# Patient Record
Sex: Female | Born: 1975 | Race: Black or African American | Hispanic: No | Marital: Single | Smoking: Never smoker
Health system: Southern US, Community
[De-identification: ages and names within clinical notes are randomized; demographics above are authoritative.]

## PROBLEM LIST (undated history)

## (undated) DIAGNOSIS — Q625 Duplication of ureter: Secondary | ICD-10-CM

## (undated) DIAGNOSIS — K259 Gastric ulcer, unspecified as acute or chronic, without hemorrhage or perforation: Secondary | ICD-10-CM

## (undated) DIAGNOSIS — D259 Leiomyoma of uterus, unspecified: Secondary | ICD-10-CM

## (undated) HISTORY — DX: Leiomyoma of uterus, unspecified: D25.9

## (undated) HISTORY — DX: Gastric ulcer, unspecified as acute or chronic, without hemorrhage or perforation: K25.9

---

## 2017-04-11 ENCOUNTER — Ambulatory Visit: Payer: Self-pay | Admitting: Certified Nurse Midwife

## 2017-04-22 ENCOUNTER — Ambulatory Visit (INDEPENDENT_AMBULATORY_CARE_PROVIDER_SITE_OTHER): Payer: BLUE CROSS/BLUE SHIELD | Admitting: Obstetrics & Gynecology

## 2017-04-22 ENCOUNTER — Encounter: Payer: Self-pay | Admitting: Obstetrics & Gynecology

## 2017-04-22 VITALS — BP 127/76 | HR 90 | Ht 66.0 in | Wt 258.0 lb

## 2017-04-22 DIAGNOSIS — R1907 Generalized intra-abdominal and pelvic swelling, mass and lump: Secondary | ICD-10-CM

## 2017-04-22 DIAGNOSIS — Z1151 Encounter for screening for human papillomavirus (HPV): Secondary | ICD-10-CM

## 2017-04-22 DIAGNOSIS — Z124 Encounter for screening for malignant neoplasm of cervix: Secondary | ICD-10-CM | POA: Diagnosis not present

## 2017-04-22 DIAGNOSIS — Z01411 Encounter for gynecological examination (general) (routine) with abnormal findings: Secondary | ICD-10-CM | POA: Diagnosis not present

## 2017-04-22 DIAGNOSIS — R1900 Intra-abdominal and pelvic swelling, mass and lump, unspecified site: Secondary | ICD-10-CM

## 2017-04-22 DIAGNOSIS — Z01419 Encounter for gynecological examination (general) (routine) without abnormal findings: Secondary | ICD-10-CM

## 2017-04-22 NOTE — Patient Instructions (Signed)
Uterine Fibroids Uterine fibroids are tissue masses (tumors) that can develop in the womb (uterus). They are also called leiomyomas. This type of tumor is not cancerous (benign) and does not spread to other parts of the body outside of the pelvic area, which is between the hip bones. Occasionally, fibroids may develop in the fallopian tubes, in the cervix, or on the support structures (ligaments) that surround the uterus. You can have one or many fibroids. Fibroids can vary in size, weight, and where they grow in the uterus. Some can become quite large. Most fibroids do not require medical treatment. What are the causes? A fibroid can develop when a single uterine cell keeps growing (replicating). Most cells in the human body have a control mechanism that keeps them from replicating without control. What are the signs or symptoms? Symptoms may include:  Heavy bleeding during your period.  Bleeding or spotting between periods.  Pelvic pain and pressure.  Bladder problems, such as needing to urinate more often (urinary frequency) or urgently.  Inability to reproduce offspring (infertility).  Miscarriages.  How is this diagnosed? Uterine fibroids are diagnosed through a physical exam. Your health care provider may feel the lumpy tumors during a pelvic exam. Ultrasonography and an MRI may be done to determine the size, location, and number of fibroids. How is this treated? Treatment may include:  Watchful waiting. This involves getting the fibroid checked by your health care provider to see if it grows or shrinks. Follow your health care provider's recommendations for how often to have this checked.  Hormone medicines. These can be taken by mouth or given through an intrauterine device (IUD).  Surgery. ? Removing the fibroids (myomectomy) or the uterus (hysterectomy). ? Removing blood supply to the fibroids (uterine artery embolization).  If fibroids interfere with your fertility and you  want to become pregnant, your health care provider may recommend having the fibroids removed. Follow these instructions at home:  Keep all follow-up visits as directed by your health care provider. This is important.  Take over-the-counter and prescription medicines only as told by your health care provider. ? If you were prescribed a hormone treatment, take the hormone medicines exactly as directed.  Ask your health care provider about taking iron pills and increasing the amount of dark green, leafy vegetables in your diet. These actions can help to boost your blood iron levels, which may be affected by heavy menstrual bleeding.  Pay close attention to your period and tell your health care provider about any changes, such as: ? Increased blood flow that requires you to use more pads or tampons than usual per month. ? A change in the number of days that your period lasts per month. ? A change in symptoms that are associated with your period, such as abdominal cramping or back pain. Contact a health care provider if:  You have pelvic pain, back pain, or abdominal cramps that cannot be controlled with medicines.  You have an increase in bleeding between and during periods.  You soak tampons or pads in a half hour or less.  You feel lightheaded, extra tired, or weak. Get help right away if:  You faint.  You have a sudden increase in pelvic pain. This information is not intended to replace advice given to you by your health care provider. Make sure you discuss any questions you have with your health care provider. Document Released: 01/12/2000 Document Revised: 09/14/2015 Document Reviewed: 07/13/2013 Elsevier Interactive Patient Education  [employer].  Uterine Artery Embolization for Fibroids, Care After Refer to this sheet in the next few weeks. These instructions provide you with information on caring for yourself after your procedure. Your health care provider may also give  you more specific instructions. Your treatment has been planned according to current medical practices, but problems sometimes occur. Call your health care provider if you have any problems or questions after your procedure. What can I expect after the procedure? After your procedure, it is typical to have cramping in the pelvis. You will be given pain medicine to control it. Follow these instructions at home:  Only take over-the-counter or prescription medicines for pain, discomfort, or fever as directed by your health care provider.  Do not take aspirin. It can cause bleeding.  Follow your health care provider's advice regarding medicines given to you, diet, activity, and when to begin sexual activity.  See your health care provider for follow-up care as directed. Contact a health care provider if:  You have a fever.  You have redness, swelling, and pain around your incision site.  You have pus draining from your incision.  You have a rash. Get help right away if:  You have bleeding from your incision site.  You have difficulty breathing.  You have chest pain.  You have severe abdominal pain.  You have leg pain.  You become dizzy and faint. This information is not intended to replace advice given to you by your health care provider. Make sure you discuss any questions you have with your health care provider. Document Released: 11/04/2012 Document Revised: 06/22/2015 Document Reviewed: 07/30/2012 Elsevier Interactive Patient Education  2018 [place] Years, Female Preventive care refers to lifestyle choices and visits with your health care provider that can promote health and wellness. What does preventive care include?  A yearly physical exam. This is also called an annual well check.  Dental exams once or twice a year.  Routine eye exams. Ask your health care provider how often you should have your eyes checked.  Personal lifestyle  choices, including: ? Daily care of your teeth and gums. ? Regular physical activity. ? Eating a healthy diet. ? Avoiding tobacco and drug use. ? Limiting alcohol use. ? Practicing safe sex. ? Taking low-dose aspirin daily starting at age 64. ? Taking vitamin and mineral supplements as recommended by your health care provider. What happens during an annual well check? The services and screenings done by your health care provider during your annual well check will depend on your age, overall health, lifestyle risk factors, and family history of disease. Counseling Your health care provider may ask you questions about your:  Alcohol use.  Tobacco use.  Drug use.  Emotional well-being.  Home and relationship well-being.  Sexual activity.  Eating habits.  Work and work Statistician.  Method of birth control.  Menstrual cycle.  Pregnancy history.  Screening You may have the following tests or measurements:  Height, weight, and BMI.  Blood pressure.  Lipid and cholesterol levels. These may be checked every 5 years, or more frequently if you are over 60 years old.  Skin check.  Lung cancer screening. You may have this screening every year starting at age 23 if you have a 30-pack-year history of smoking and currently smoke or have quit within the past 15 years.  Fecal occult blood test (FOBT) of the stool. You may have this test every year starting at age 64.  Flexible sigmoidoscopy or colonoscopy. You  may have a sigmoidoscopy every 5 years or a colonoscopy every 10 years starting at age 31.  Hepatitis C blood test.  Hepatitis B blood test.  Sexually transmitted disease (STD) testing.  Diabetes screening. This is done by checking your blood sugar (glucose) after you have not eaten for a while (fasting). You may have this done every 1-3 years.  Mammogram. This may be done every 1-2 years. Talk to your health care provider about when you should start having regular  mammograms. This may depend on whether you have a family history of breast cancer.  BRCA-related cancer screening. This may be done if you have a family history of breast, ovarian, tubal, or peritoneal cancers.  Pelvic exam and Pap test. This may be done every 3 years starting at age 41. Starting at age 17, this may be done every 5 years if you have a Pap test in combination with an HPV test.  Bone density scan. This is done to screen for osteoporosis. You may have this scan if you are at high risk for osteoporosis.  Discuss your test results, treatment options, and if necessary, the need for more tests with your health care provider. Vaccines Your health care provider may recommend certain vaccines, such as:  Influenza vaccine. This is recommended every year.  Tetanus, diphtheria, and acellular pertussis (Tdap, Td) vaccine. You may need a Td booster every 10 years.  Varicella vaccine. You may need this if you have not been vaccinated.  Zoster vaccine. You may need this after age 50.  Measles, mumps, and rubella (MMR) vaccine. You may need at least one dose of MMR if you were born in 1957 or later. You may also need a second dose.  Pneumococcal 13-valent conjugate (PCV13) vaccine. You may need this if you have certain conditions and were not previously vaccinated.  Pneumococcal polysaccharide (PPSV23) vaccine. You may need one or two doses if you smoke cigarettes or if you have certain conditions.  Meningococcal vaccine. You may need this if you have certain conditions.  Hepatitis A vaccine. You may need this if you have certain conditions or if you travel or work in places where you may be exposed to hepatitis A.  Hepatitis B vaccine. You may need this if you have certain conditions or if you travel or work in places where you may be exposed to hepatitis B.  Haemophilus influenzae type b (Hib) vaccine. You may need this if you have certain conditions.  Talk to your health care  provider about which screenings and vaccines you need and how often you need them. This information is not intended to replace advice given to you by your health care provider. Make sure you discuss any questions you have with your health care provider. Document Released: 02/10/2015 Document Revised: 10/04/2015 Document Reviewed: 11/15/2014 Elsevier Interactive Patient Education  [employer].

## 2017-04-23 ENCOUNTER — Encounter: Payer: Self-pay | Admitting: Obstetrics & Gynecology

## 2017-04-23 LAB — CA 125: CANCER ANTIGEN (CA) 125: 29.5 U/mL (ref 0.0–38.1)

## 2017-04-23 LAB — COMPREHENSIVE METABOLIC PANEL
ALT: 9 IU/L (ref 0–32)
AST: 13 IU/L (ref 0–40)
Albumin/Globulin Ratio: 1.5 (ref 1.2–2.2)
Albumin: 4.6 g/dL (ref 3.5–5.5)
Alkaline Phosphatase: 76 IU/L (ref 39–117)
BUN/Creatinine Ratio: 11 (ref 9–23)
BUN: 10 mg/dL (ref 6–24)
Bilirubin Total: 0.4 mg/dL (ref 0.0–1.2)
CALCIUM: 9.2 mg/dL (ref 8.7–10.2)
CO2: 22 mmol/L (ref 20–29)
Chloride: 102 mmol/L (ref 96–106)
Creatinine, Ser: 0.88 mg/dL (ref 0.57–1.00)
GFR calc Af Amer: 94 mL/min/{1.73_m2} (ref >59)
GFR, EST NON AFRICAN AMERICAN: 82 mL/min/{1.73_m2} (ref >59)
GLUCOSE: 81 mg/dL (ref 65–99)
Globulin, Total: 3.1 g/dL (ref 1.5–4.5)
POTASSIUM: 3.7 mmol/L (ref 3.5–5.2)
Sodium: 140 mmol/L (ref 134–144)
Total Protein: 7.7 g/dL (ref 6.0–8.5)

## 2017-04-23 LAB — CBC
Hematocrit: 43.3 % (ref 34.0–46.6)
Hemoglobin: 14.1 g/dL (ref 11.1–15.9)
MCH: 28.6 pg (ref 26.6–33.0)
MCHC: 32.6 g/dL (ref 31.5–35.7)
MCV: 88 fL (ref 79–97)
Platelets: 148 10*3/uL — ABNORMAL LOW (ref 150–379)
RBC: 4.93 x10E6/uL (ref 3.77–5.28)
RDW: 14.3 % (ref 12.3–15.4)
WBC: 4.2 10*3/uL (ref 3.4–10.8)

## 2017-04-23 LAB — HEMOGLOBIN A1C
ESTIMATED AVERAGE GLUCOSE: 103 mg/dL
Hgb A1c MFr Bld: 5.2 % (ref 4.8–5.6)

## 2017-04-23 LAB — TSH: TSH: 1.35 u[IU]/mL (ref 0.450–4.500)

## 2017-04-23 NOTE — Addendum Note (Signed)
Addended by: Gretchen Short on: 04/23/2017 01:38 PM   Modules accepted: Orders

## 2017-04-23 NOTE — Addendum Note (Signed)
Addended by: Gretchen Short on: 04/23/2017 01:39 PM   Modules accepted: Orders

## 2017-04-23 NOTE — Addendum Note (Signed)
Addended by: Verita Schneiders A on: 04/23/2017 01:39 PM   Modules accepted: Orders

## 2017-04-23 NOTE — Progress Notes (Addendum)
GYNECOLOGY ANNUAL PREVENTATIVE CARE ENCOUNTER NOTE  Subjective:   Vanessa Elliott is a 42 y.o. G17P1001 female here for a routine annual gynecologic exam.  Current complaints: fibroid uterus for which she has not been evaluated in several years. Denies any heavy bleeding but it feels heavy and can be uncomfortable sometimes.   Denies abnormal vaginal bleeding, discharge, pelvic pain or other gynecologic concerns. Has not been sexually active for several years. No weight loss, fevers, or other systemic symptoms.   Gynecologic History Patient's last menstrual period was 04/13/2017. Contraception: abstinenence Last Pap: Many years ago. Results were: normal Never had a mammogram.   Obstetric History OB History  Gravida Para Term Preterm AB Living  1 1 1  0 0 1  SAB TAB Ectopic Multiple Live Births  0 0 0 0 1    # Outcome Date GA Lbr Len/2nd Weight Sex Delivery Anes PTL Lv  1 Term      Vag-Spont   LIV    Past Medical History:  Diagnosis Date  . Stomach ulcer   . Uterine fibroid     History reviewed. No pertinent surgical history.  No current outpatient medications on file prior to visit.   No current facility-administered medications on file prior to visit.     No Known Allergies  Social History   Socioeconomic History  . Marital status: Single    Spouse name: Not on file  . Number of children: Not on file  . Years of education: Not on file  . Highest education level: Not on file  Occupational History  . Not on file  Social Needs  . Financial resource strain: Not on file  . Food insecurity:    Worry: Not on file    Inability: Not on file  . Transportation needs:    Medical: Not on file    Non-medical: Not on file  Tobacco Use  . Smoking status: Never Smoker  . Smokeless tobacco: Never Used  Substance and Sexual Activity  . Alcohol use: Not Currently  . Drug use: Not Currently  . Sexual activity: Not Currently  Lifestyle  . Physical activity:    Days per  week: Not on file    Minutes per session: Not on file  . Stress: Not on file  Relationships  . Social connections:    Talks on phone: Not on file    Gets together: Not on file    Attends religious service: Not on file    Active member of club or organization: Not on file    Attends meetings of clubs or organizations: Not on file    Relationship status: Not on file  . Intimate partner violence:    Fear of current or ex partner: Not on file    Emotionally abused: Not on file    Physically abused: Not on file    Forced sexual activity: Not on file  Other Topics Concern  . Not on file  Social History Narrative  . Not on file    Family History  Problem Relation Age of Onset  . Diabetes Maternal Grandmother     The following portions of the patient's history were reviewed and updated as appropriate: allergies, current medications, past family history, past medical history, past social history, past surgical history and problem list.  Review of Systems Pertinent items noted in HPI and remainder of comprehensive ROS otherwise negative.   Objective:  BP 127/76   Pulse 90   Ht 5\' 6"  (1.676 m)  Wt 258 lb (117 kg)   LMP 04/13/2017   BMI 41.64 kg/m  CONSTITUTIONAL: Well-developed, well-nourished female in no acute distress.  HENT:  Normocephalic, atraumatic, External right and left ear normal. Oropharynx is clear and moist EYES: Conjunctivae and EOM are normal. Pupils are equal, round, and reactive to light. No scleral icterus.  NECK: Normal range of motion, supple, no masses.  Normal thyroid.  SKIN: Skin is warm and dry. No rash noted. Not diaphoretic. No erythema. No pallor. NEUROLOGIC: Alert and oriented to person, place, and time. Normal reflexes, muscle tone coordination. No cranial nerve deficit noted. PSYCHIATRIC: Flat affect. Normal behavior. Normal judgment and thought content. CARDIOVASCULAR: Normal heart rate noted, regular rhythm RESPIRATORY: Clear to auscultation  bilaterally. Effort and breath sounds normal, no problems with respiration noted. BREASTS: Symmetric in size. No masses, skin changes, nipple drainage, or lymphadenopathy. ABDOMEN: Large (40 cm in height from pubic symphysis and at least 40 cm in width) mass; solid, largely immobile; up to xyphoid, mildly tender when trying to move it likely due to size of it. No tympany noted. No rebound or guarding.  PELVIC: Normal appearing external genitalia; normal appearing vaginal mucosa and cervix.  No abnormal discharge noted.  Pap smear obtained.  Large abdominopelvic mass as stated above, could not palpate adnexa. MUSCULOSKELETAL: Normal range of motion. No tenderness.  No cyanosis, clubbing, or edema.  2+ distal pulses.   Assessment and Plan:  1. Pelvic mass 2. Generalized intra-abdominal and pelvic swelling, mass and lump Very large (40 cm from pubic symphysis), solid, largely immobile mass on examination.  Could be large fibroid or may also be arising from adnexa or other pelvic organ. Very concerning.  Will get MR of abdomen and pelvis for further characterization of this mass, also get CA-125. Will plan surgery or refer to GYN ONC depending on the results. May also need IR referral for preoperative [place] if this is coming from the uterus. - MR ABDOMEN W WO CONTRAST; Future - MR PELVIS W WO CONTRAST; Future - CA 125  3. Well woman exam with routine gynecological exam Pap, mammogram and routine healthcare maintenance labs done - Cytology - PAP - MM SCREENING BREAST TOMO BILATERAL; Future - TSH - Beta hCG - CBC - Comprehensive metabolic panel - Hemoglobin A1c Will follow up results of pap smear and manage accordingly. Mammogram scheduled. Routine preventative health maintenance measures emphasized. Please refer to After Visit Summary for other counseling recommendations.   Will follow up after MRI is done ro discuss results of MRI with the patient, and possible surgical intervention/referral  to GYN ONC or other indicated services.    Verita Schneiders, MD, [place] for [employer], [place]

## 2017-04-25 LAB — SPECIMEN STATUS REPORT

## 2017-04-25 LAB — CYTOLOGY - PAP
Diagnosis: NEGATIVE
HPV: NOT DETECTED

## 2017-04-25 LAB — BETA HCG QUANT (REF LAB): hCG Quant: <1 m[IU]/mL

## 2017-05-02 ENCOUNTER — Ambulatory Visit
Admission: RE | Admit: 2017-05-02 | Discharge: 2017-05-02 | Disposition: A | Discharge status: Home / Self Care | Payer: BLUE CROSS/BLUE SHIELD | Source: Ambulatory Visit | Attending: Obstetrics & Gynecology | Admitting: Obstetrics & Gynecology

## 2017-05-02 ENCOUNTER — Encounter: Payer: Self-pay | Admitting: Radiology

## 2017-05-02 DIAGNOSIS — N852 Hypertrophy of uterus: Secondary | ICD-10-CM | POA: Diagnosis not present

## 2017-05-02 DIAGNOSIS — R1907 Generalized intra-abdominal and pelvic swelling, mass and lump: Secondary | ICD-10-CM

## 2017-05-02 DIAGNOSIS — R1900 Intra-abdominal and pelvic swelling, mass and lump, unspecified site: Secondary | ICD-10-CM

## 2017-05-02 MED ORDER — GADOBENATE DIMEGLUMINE 529 MG/ML IV SOLN
20.0000 mL | Freq: Once | INTRAVENOUS | Status: AC | PRN
  Administered 2017-05-02: 20 mL via INTRAVENOUS

## 2017-05-06 ENCOUNTER — Encounter: Payer: Self-pay | Admitting: Radiology

## 2017-05-07 ENCOUNTER — Telehealth: Payer: Self-pay | Admitting: Radiology

## 2017-05-07 ENCOUNTER — Telehealth: Payer: Self-pay

## 2017-05-07 NOTE — Telephone Encounter (Signed)
Received message from patient concerning results of test results. Test results were given to patient advised patient that provider has not made any notes on results so if anything changes we will call her back. Patient agreeable with pain.

## 2017-05-07 NOTE — Telephone Encounter (Signed)
Left voicemail for patient to call [id number] to reschedule appointment with Dr Harolyn Rutherford. Patient scheduled for 05/09/17 with Dr Ilda Basset, he suggested that she f/u with Dr Harolyn Rutherford. Instructed patient to call back to reschedule

## 2017-05-07 NOTE — Telephone Encounter (Signed)
-----   Message from Blanchie Dessert, [place] sent at 05/07/2017 11:18 AM EDT ----- Regarding: lab results Contact: [phone] Please call patient with lab results

## 2017-05-09 ENCOUNTER — Ambulatory Visit: Payer: BLUE CROSS/BLUE SHIELD | Admitting: Obstetrics and Gynecology

## 2017-05-26 ENCOUNTER — Encounter: Payer: Self-pay | Admitting: Obstetrics & Gynecology

## 2017-05-26 ENCOUNTER — Ambulatory Visit (INDEPENDENT_AMBULATORY_CARE_PROVIDER_SITE_OTHER): Payer: BLUE CROSS/BLUE SHIELD | Admitting: Obstetrics & Gynecology

## 2017-05-26 ENCOUNTER — Other Ambulatory Visit: Payer: Self-pay | Admitting: Obstetrics & Gynecology

## 2017-05-26 VITALS — BP 129/84 | HR 72 | Wt 258.0 lb

## 2017-05-26 DIAGNOSIS — D251 Intramural leiomyoma of uterus: Secondary | ICD-10-CM | POA: Diagnosis not present

## 2017-05-26 DIAGNOSIS — Z1231 Encounter for screening mammogram for malignant neoplasm of breast: Secondary | ICD-10-CM

## 2017-05-26 NOTE — Progress Notes (Signed)
GYNECOLOGY OFFICE VISIT NOTE  History:  42 y.o. G1P1001 here today for review of MRI results; MRI ordered for evaluation of abdomino-pelvic mass.  She is also here to discuss management.  Reports continued pressure from mass, no new symptoms.  She denies any abnormal vaginal discharge, bleeding, pelvic pain or other concerns.   Past Medical History:  Diagnosis Date  . Stomach ulcer   . Uterine fibroid     No past surgical history on file.  The following portions of the patient's history were reviewed and updated as appropriate: allergies, current medications, past family history, past medical history, past social history, past surgical history and problem list.   Health Maintenance:  Normal pap and negative HRHPV on 04/22/2017.  Mammogram in 04/2017 should possible breast abnormality, likely benign but awaiting further imaging at this point (already scheduled).  Review of Systems:  Pertinent items noted in HPI and remainder of comprehensive ROS otherwise negative.  Objective:  Physical Exam BP 129/84   Pulse 72   Wt 258 lb (117 kg)   LMP 05/07/2017   BMI 41.64 kg/m  CONSTITUTIONAL: Well-developed, well-nourished female in no acute distress.  HENT:  Normocephalic, atraumatic. External right and left ear normal. Oropharynx is clear and moist EYES: Conjunctivae and EOM are normal. Pupils are equal, round, and reactive to light. No scleral icterus.  NECK: Normal range of motion, supple, no masses SKIN: Skin is warm and dry. No rash noted. Not diaphoretic. No erythema. No pallor. NEUROLOGIC: Alert and oriented to person, place, and time. Normal reflexes, muscle tone coordination. No cranial nerve deficit noted. PSYCHIATRIC: Flat affect. Normal behavior. Normal judgment and thought content. CARDIOVASCULAR: Normal heart rate noted RESPIRATORY: Effort and breath sounds normal, no problems with respiration noted ABDOMEN: Soft, distention by large mass noted.   PELVIC:  Deferred MUSCULOSKELETAL: Normal range of motion. No edema noted.  Labs and Imaging Mr Pelvis W Wo Contrast  Result Date: 05/02/2017 CLINICAL DATA:  Large abdominal and pelvic mass on physical exam. EXAM: MRI PELVIS WITHOUT AND WITH CONTRAST TECHNIQUE: Multiplanar multisequence MR imaging of the pelvis was performed both before and after administration of intravenous contrast. CONTRAST:  61mL MULTIHANCE GADOBENATE DIMEGLUMINE 529 MG/ML IV SOLN COMPARISON:  None. FINDINGS: Urinary Tract: No urinary bladder or urethral abnormality. Bowel: Unremarkable pelvic bowel loops. Vascular/Lymphatic: Unremarkable. No pathologically enlarged pelvic lymph nodes identified. Reproductive: -- Uterus: Measures 29.7 x 19.2 by 25.2 cm (volume = 7520 cm^3). A single large central uterine fibroid is seen which measures 21.5 x 16.2 x 20.2 cm (volume = 3680 cm^3). This fibroid shows diffuse heterogeneous contrast enhancement. No other fibroids identified. Cervix is normal in appearance. -- Right ovary: Not visualized, however no adnexal mass identified. -- Left ovary:  Not visualized, however no adnexal mass identified. Other: No peritoneal thickening or abnormal free fluid. No evidence of hydronephrosis. Musculoskeletal:  Unremarkable. IMPRESSION: Markedly enlarged uterus, with single central fibroid measuring 21.5 cm. Nonvisualization of ovaries, however no adnexal mass identified. Electronically Signed   By: Earle Gell M.D.   On: 05/02/2017 11:25    Assessment & Plan:  1. Large fibroid uterus Discussed imaging results in details, also reviewed images with patient. All questions answered. Given the size of her fibroid uterus, and given that she is done with childbearing,  I proposed doing a total abdominal hysterectomy (TAH) and prophylactic bilateral salpingectomy.  No indication for oophorectomy.  She was told that a large vertical incision will be needed given size of her uterus. Also concerned  about operative blood loss;  counseled in detail about preoperative Lupron and uterine artery embolization by Interventional Radiology (IR)  She is considering both modalities, leaning more towards Lupron.  Wary about [place] at this point, wants to avoid this.  I am also considering ureteral stenting or other procedures to help make the surgery safer for patient.  She was told that I will consult with other surgeons, particularly, Gynecology Oncology (Dr. Everitt Amber), given complexity of this surgery and her expertise with large pelvic masses and ligation of blood vessels (such as hypogastric vessel ligation) if needed to control blood loss.  She is hesistant to meet with Dr. Denman George or any other surgeon, but gave verbal consent for me to consult with them for preoperative planning.  Will also determine if the operation can be safely done at [facility], given the possible need for other resources. Other routine risks of surgery were discussed in detail with the patient including but not limited to: aforementioned bleeding which may require transfusion of multiple blood products, ligation of vessels or reoperation; infection which may require antibiotics; injury to bowel, bladder, ureters or other surrounding organs; need for additional procedures including laparotomy; thromboembolic phenomenon, incisional problems and other postoperative/anesthesia complications.  Patient was also advised that she will remain in house for 2 nights; and expected recovery time after this sort of hysterectomy 8 weeks.  Likelihood of success in alleviating the patient's symptoms was discussed.   Once the consults are made and preoperative planning is decided and agreed to; will send order to surgical scheduler.  I will contact patient after I curbside GYN ONC and will have her decide on Lupron  +/- [place]. Timing of surgery will depend on this intervention.  In the meantime, she will continue pain medications as needed; pain and bleeding precautions were reviewed.  Printed patient education handouts about the procedure was given to the patient to review at home.   Total face-to-face time with patient: 25 minutes.  Over 50% of encounter was spent on counseling and coordination of care.   Verita Schneiders, MD, [place] for [employer], [place]

## 2017-05-27 ENCOUNTER — Encounter: Payer: Self-pay | Admitting: Obstetrics & Gynecology

## 2017-05-27 NOTE — Patient Instructions (Addendum)
Abdominal Hysterectomy °Abdominal hysterectomy is a surgical procedure to remove the womb (uterus). The uterus is the muscular organ that houses a developing baby. This surgery may be done if: °· You have cancer. °· You have growths (tumors or fibroids) in the uterus. °· You have long-term (chronic) pain. °· You are bleeding. °· Your uterus has slipped down into your vagina (uterine prolapse). °· You have a condition in which the tissue that lines the uterus grows outside of its normal location (endometriosis). °· You have an infection in your uterus. °· You are having problems with your menstrual cycle. ° °Depending on why you are having this procedure, you may also have other reproductive organs removed. These could include: °· The part of your vagina that connects with your uterus (cervix). °· The organs that make eggs (ovaries). °· The tubes that connect the ovaries to the uterus (fallopian tubes). ° °Tell a health care provider about: °· Any allergies you have. °· All medicines you are taking, including vitamins, herbs, eye drops, creams, and over-the-counter medicines. °· Any problems you or family members have had with anesthetic medicines. °· Any blood disorders you have. °· Any surgeries you have had. °· Any medical conditions you have. °· Whether you are pregnant or may be pregnant. °What are the risks? °Generally, this is a safe procedure. However, problems may occur, including: °· Bleeding. °· Infection. °· Allergic reactions to medicines or dyes. °· Damage to other structures or organs. °· Nerve injury. °· Decreased interest in sex or pain during sex. °· Blood clots that can break free and travel to your lungs. ° °What happens before the procedure? °Staying hydrated °Follow instructions from your health care provider about hydration, which may include: °· Up to 2 hours before the procedure - you may continue to drink clear liquids, such as water, clear fruit juice, black coffee, and plain tea ° °Eating  and drinking restrictions °Follow instructions from your health care provider about eating and drinking, which may include: °· 8 hours before the procedure - stop eating heavy meals or foods such as meat, fried foods, or fatty foods. °· 6 hours before the procedure - stop eating light meals or foods, such as toast or cereal. °· 6 hours before the procedure - stop drinking milk or drinks that contain milk. °· 2 hours before the procedure - stop drinking clear liquids. ° °Medicines °· Ask your health care provider about: °? Changing or stopping your regular medicines. This is especially important if you are taking diabetes medicines or blood thinners. °? Taking medicines such as aspirin and ibuprofen. These medicines can thin your blood. Do not take these medicines before your procedure if your health care provider instructs you not to. °· You may be given antibiotic medicine to help prevent infection. Take it as told by your health care provider. °· You may be asked to take laxatives to prevent constipation. °General instructions °· Ask your health care provider how your surgical site will be marked or identified. °· You may be asked to shower with a germ-killing soap. °· Plan to have someone take you home from the hospital. °· Do not use any products that contain nicotine or tobacco, such as cigarettes and e-cigarettes. If you need help quitting, ask your health care provider. °· You may have an exam or testing. °· You may have a blood or urine sample taken. °· You may need to have an enema to clean out your rectum and lower colon. °· This   procedure can affect the way you feel about yourself. Talk to your health care provider about the physical and emotional changes this procedure may cause. What happens during the procedure?  To lower your risk of infection: ? Your health care team will wash or sanitize their hands. ? Your skin will be washed with soap. ? Hair may be removed from the surgical area.  An IV  tube will be inserted into one of your veins.  You will be given one or more of the following: ? A medicine to help you relax (sedative). ? A medicine to make you fall asleep (general anesthetic).  Tight-fitting (compression) stockings will be placed on your legs to promote circulation.  A thin, flexible tube (catheter) will be inserted to help drain your urine.  The surgeon will make a cut (incision) through the skin in your lower belly. The incision may go side-to-side or up-and-down.  The surgeon will move aside the body tissue that covers your uterus. The surgeon will then carefully take out your uterus along with any of the other organs that need to be removed.  Bleeding will be controlled with clamps or sutures.  The surgeon will close your incision with stitches (sutures), skin glue, or adhesive strips.  A bandage (dressing) will be placed over the incision. The procedure may vary among health care providers and hospitals. What happens after the procedure?  You will be given pain medicine as needed.  Your blood pressure, heart rate, breathing rate, and blood oxygen level will be monitored until the medicines you were given have worn off.  You will need to stay in the hospital to recover for one to two days. Ask your health care provider how long you will need to stay in the hospital after your procedure.  You may have a liquid diet at first. You will most likely return to your usual diet the day after surgery.  You will still have the urinary catheter in place. It will likely be removed the day after surgery.  You may have to wear compression stockings. These stockings help to prevent blood clots and reduce swelling in your legs.  You will be encouraged to walk as soon as possible. You will also use a device or do breathing exercises to keep your lungs clear.  You may need to use a sanitary napkin for vaginal discharge. Summary  Abdominal hysterectomy is a surgical  procedure to remove the womb (uterus). The uterus is the muscular organ that houses a developing baby.  This procedure can affect the way you feel about yourself. Talk to your health care provider about the physical and emotional changes this procedure may cause.  You will be given medicines for pain after the procedure.  You will need to stay in the hospital to recover. Ask your health care provider how long you will need to stay in the hospital after your procedure. This information is not intended to replace advice given to you by your health care provider. Make sure you discuss any questions you have with your health care provider. Document Released: 01/19/2013 Document Revised: 01/03/2016 Document Reviewed: 01/03/2016 Elsevier Interactive Patient Education  2017 [employer].   Uterine Artery Embolization for Fibroids Uterine artery embolization is a nonsurgical treatment to shrink fibroids. A thin plastic tube (catheter) is used to inject material that blocks off the blood supply to the fibroid, which causes the fibroid to shrink. Tell a health care provider about:  Any allergies you have.  All medicines  you are taking, including vitamins, herbs, eye drops, creams, and over-the-counter medicines.  Any problems you or family members have had with anesthetic medicines.  Any blood disorders you have.  Any surgeries you have had.  Any medical conditions you have. What are the risks?  Injury to the uterus from decreased blood supply  Infection.  Blood infection (septicemia).  Lack of menstrual periods (amenorrhea).  Death of tissue cells (necrosis) around your bladder or vulva.  Development of a hole between organs or from an organ to the surface of your skin (fistula).  Blood clot in the legs (deep vein thrombosis) or lung (pulmonary embolus). What happens before the procedure?  Ask your health care provider about changing or stopping your regular medicines.  Do not  take aspirin or blood thinners (anticoagulants) for 1 week before the surgery or as directed by your health care provider.  Do not eat or drink anything for 8 hours before the surgery or as directed by your health care provider.  Empty your bladder before the procedure begins. What happens during the procedure?  An IV tube will be placed into one of your veins. This will be used to give you a sedative and pain medication (conscious sedation).  You will be given a medicine that numbs the area (local anesthetic).  A small cut will be made in your groin. A catheter is then inserted into the main artery of your leg.  The catheter will be guided through the artery to your uterus. A series of images will be taken while dye is injected through the catheter in your groin. X-rays are taken at the same time. This is done to provide a road map of the blood supply to your uterus and fibroids.  Tiny plastic spheres, about the size of sand grains, will be injected through the catheter. Metal coils may be used to help block the artery. The particles will lodge in tiny branches of the uterine artery that supplies blood to the fibroids.  The procedure is repeated on the artery that supplies the other side of the uterus.  The catheter is then removed and pressure is held to stop any bleeding. No stitches are needed.  A dressing is then placed over the cut (incision). What happens after the procedure?  You will be taken to a recovery area where your progress will be monitored until you are awake, stable, and taking fluids well. If there are no other problems, you will then be moved to a regular hospital room.  You will be observed overnight in the hospital.  You will have cramping that should be controlled with pain medication. This information is not intended to replace advice given to you by your health care provider. Make sure you discuss any questions you have with your health care provider. Document  Released: 04/01/2005 Document Revised: 06/22/2015 Document Reviewed: 07/30/2012 Elsevier Interactive Patient Education  [employer].

## 2017-06-16 ENCOUNTER — Telehealth: Payer: Self-pay | Admitting: Obstetrics & Gynecology

## 2017-06-16 ENCOUNTER — Telehealth: Payer: Self-pay | Admitting: Radiology

## 2017-06-16 NOTE — Telephone Encounter (Signed)
[facility] OB/GYN Physician Phone Call Documentation  I called Artina Minella to find out if she has made any decisions about what was discussed during our prior encounter on 05/26/17; and also to discuss recommendations from Dr. Denman George ([place]) for preoperative planning.  I got a voicemail message; I left a message and she was told to call back but was also told that an appointment will be made for her to come in for a face-to-face discussion with me this week. Message sent to office staff at [facility] to arrange this.    Verita Schneiders, MD, [place] for [employer], [place]

## 2017-06-16 NOTE — Telephone Encounter (Signed)
Left patient a message to call [facility] to schedule appointment with Dr Harolyn Rutherford for for this week 06/16/17-06/20/17

## 2017-06-19 ENCOUNTER — Telehealth: Payer: Self-pay | Admitting: Radiology

## 2017-06-19 NOTE — Telephone Encounter (Signed)
Left voicemail explaining that Dr Harolyn Rutherford would like to see her in the office for a F/U visit. Please contact [facility], provided phone number

## 2017-07-25 ENCOUNTER — Encounter: Payer: Self-pay | Admitting: Obstetrics & Gynecology

## 2017-07-25 ENCOUNTER — Ambulatory Visit: Payer: BLUE CROSS/BLUE SHIELD | Admitting: Obstetrics & Gynecology

## 2017-07-25 VITALS — BP 127/84 | HR 72 | Wt 259.0 lb

## 2017-07-25 DIAGNOSIS — D251 Intramural leiomyoma of uterus: Secondary | ICD-10-CM | POA: Diagnosis not present

## 2017-07-25 DIAGNOSIS — Z01818 Encounter for other preprocedural examination: Secondary | ICD-10-CM

## 2017-07-25 NOTE — Progress Notes (Signed)
GYNECOLOGY OFFICE VISIT NOTE  History:  42 y.o. G1P1001 here today for preoperative discussion about management of her large fibroid uterus.  Discussed this patient with Dr. Denman George ([facility]N [facility]); she recommended consultation with her, preoperative [place], ureteral stents, and Lupron. Patient denies any abnormal vaginal discharge, bleeding, pelvic pain or other concerns.   Past Medical History:  Diagnosis Date  . Stomach ulcer   . Uterine fibroid    No past surgical history on file.  The following portions of the patient's history were reviewed and updated as appropriate: allergies, current medications, past family history, past medical history, past social history, past surgical history and problem list.   Health Maintenance:  Normal pap and negative HRHPV on 04/22/2017. Normal mammogram on 06/11/2017.   Review of Systems:  Pertinent items noted in HPI and remainder of comprehensive ROS otherwise negative.  Objective:  Physical Exam BP 127/84   Pulse 72   Wt 259 lb (117.5 kg)   BMI 41.80 kg/m  CONSTITUTIONAL: Well-developed, well-nourished female in no acute distress.  HEENT:  Normocephalic, atraumatic. External right and left ear normal. No scleral icterus.  NECK: Normal range of motion, supple, no masses noted on observation SKIN: Skin is warm and dry. No rash noted. Not diaphoretic. No erythema. No pallor. MUSCULOSKELETAL: Normal range of motion. No edema noted. NEUROLOGIC: Alert and oriented to person, place, and time. Normal muscle tone coordination. No cranial nerve deficit noted. PSYCHIATRIC: Normal mood and affect. Normal behavior. Normal judgment and thought content. CARDIOVASCULAR: Normal heart rate noted RESPIRATORY: Effort and breath sounds normal, no problems with respiration noted ABDOMEN: Soft, distention by large mass noted.   PELVIC: Deferred  Labs and Imaging 05/02/2017  MRI PELVIS WITHOUT AND WITH CONTRAST CLINICAL DATA:  Large abdominal and pelvic mass on physical  exam.  FINDINGS: Urinary Tract: No urinary bladder or urethral abnormality. Bowel: Unremarkable pelvic bowel loops. Vascular/Lymphatic: Unremarkable. No pathologically enlarged pelvic lymph nodes identified. Reproductive: -- Uterus: Measures 29.7 x 19.2 by 25.2 cm (volume = 7520 cm^3). A single large central uterine fibroid is seen which measures 21.5 x 16.2 x 20.2 cm (volume = 3680 cm^3). This fibroid shows diffuse heterogeneous contrast enhancement. No other fibroids identified. Cervix is normal in appearance. -- Right ovary: Not visualized, however no adnexal mass identified. -- Left ovary:  Not visualized, however no adnexal mass identified. Other: No peritoneal thickening or abnormal free fluid. No evidence of hydronephrosis. Musculoskeletal:  Unremarkable. IMPRESSION: Markedly enlarged uterus, with single central fibroid measuring 21.5 cm. Nonvisualization of ovaries, however no adnexal mass identified. Electronically Signed   By: Earle Gell M.D.   On: 05/02/2017   Assessment & Plan:  1. Large fibroid uterus 2. Preoperative exam for gynecologic surgery Patient declined Lupron; this is fine because she is willing to undergo [place] (better control for bleeding). Agrees to do preoperative ureteral stent, wants this to be done day of surgery when she is already under anesthesia.  Willing to meet with specialists who will perform these procedures, referrals made. Patient understands this will take weeks to coordinate, this works for her as she needs to be make arrangements at her job.  - Ambulatory referral to Gynecologic Oncology - Ambulatory referral to Interventional Radiology - Ambulatory referral to Urology She will be contacted with these appointments and for time of surgery; she will undergo total abdominal hysterectomy, bilateral salpingectomy (refer to my note on 05/26/2017) for more details.  Return if symptoms worsen or fail to improve.  Total face-to-face time with  patient: 25 minutes.  Over  50% of encounter was spent on counseling and coordination of care.   Vanessa Schneiders, MD, [place] for [employer], [place]

## 2017-07-25 NOTE — Patient Instructions (Signed)
Ureteral Stent Implantation Ureteral stent implantation is a procedure to insert (implant) a flexible, soft, plastic tube (stent) into a tube (ureter) that drains urine from the kidneys. The stent supports the ureter while it heals and helps to drain urine from the kidneys. You may have a ureteral stent implanted after having a procedure to remove a blockage from the ureter (ureterolysis or pyeloplasty).You may also have a stent implanted to open the flow of urine when you have a blockage caused by a kidney stone, tumor, blood clot, or infection. You have two ureters, one on each side of the body. The ureters connect the kidneys to the organ that holds urine until it passes out of the body (bladder). The stent is placed so that one end is in the kidney, and one end is in the bladder. The stent is usually taken out after your ureter has healed. Depending on your condition, you may have a stent for just a few weeks, or you may have a long-term stent that will need to be replaced every few months. Tell a health care provider about:  Any allergies you have.  All medicines you are taking, including vitamins, herbs, eye drops, creams, and over-the-counter medicines.  Any problems you or family members have had with anesthetic medicines.  Any blood disorders you have.  Any surgeries you have had.  Any medical conditions you have.  Whether you are pregnant or may be pregnant. What are the risks? Generally, this is a safe procedure. However, problems may occur, including:  Infection.  Bleeding.  Allergic reactions to medicines.  Damage to other structures or organs. Tearing (perforation) of the ureter is possible.  Movement of the stent away from where it is placed during surgery (migration).  What happens before the procedure?  Ask your health care provider about: ? Changing or stopping your regular medicines. This is especially important if you are taking diabetes medicines or blood  thinners. ? Taking medicines such as aspirin and ibuprofen. These medicines can thin your blood. Do not take these medicines before your procedure if your health care provider instructs you not to.  Follow instructions from your health care provider about eating or drinking restrictions.  Do not drink alcohol and do not use any tobacco products before your procedure, as told by your health care provider.  You may be given antibiotic medicine to help prevent infection.  Plan to have someone take you home after the procedure.  If you go home right after the procedure, plan to have someone with you for 24 hours. What happens during the procedure?  An IV tube will be inserted into one of your veins.  You will be given a medicine to make you fall asleep (general anesthetic). You may also be given a medicine to help you relax (sedative).  A thin, tube-shaped instrument with a light and tiny camera at the end (cystoscope) will be inserted into your urethra. The urethra is the tube that drains urine from the bladder out of the body. In men, the urethra opens at the end of the penis. In women, the urethra opens in front of the vaginal opening.  The cystoscope will be passed into your bladder.  A thin wire (guide wire) will be passed through your bladder and into your ureter. This is used to guide the stent into your ureter.  The stent will be inserted into your ureter.  The guide wire and the cystoscope will be removed.  A flexible tube (catheter)  will be inserted through your urethra so that one end is in your bladder. This helps to drain urine from your bladder. The procedure may vary among hospitals and health care providers. What happens after the procedure?  Your blood pressure, heart rate, breathing rate, and blood oxygen level will be monitored often until the medicines you were given have worn off.  You may continue to receive medicine and fluids through an IV tube.  You may have  some soreness or pain in your abdomen and urethra. Medicines will be available to help you.  You will be encouraged to get up and walk around as soon as you can.  You will have a catheter draining your urine.  You will have some blood in your urine.  Do not drive for 24 hours if you received a sedative. This information is not intended to replace advice given to you by your health care provider. Make sure you discuss any questions you have with your health care provider. Document Released: 01/12/2000 Document Revised: 06/22/2015 Document Reviewed: 07/29/2014 Elsevier Interactive Patient Education  2018 [place]. Uterine Artery Embolization for Fibroids Uterine artery embolization is a nonsurgical treatment to shrink fibroids. A thin plastic tube (catheter) is used to inject material that blocks off the blood supply to the fibroid, which causes the fibroid to shrink. Tell a health care provider about:  Any allergies you have.  All medicines you are taking, including vitamins, herbs, eye drops, creams, and over-the-counter medicines.  Any problems you or family members have had with anesthetic medicines.  Any blood disorders you have.  Any surgeries you have had.  Any medical conditions you have. What are the risks?  Injury to the uterus from decreased blood supply  Infection.  Blood infection (septicemia).  Lack of menstrual periods (amenorrhea).  Death of tissue cells (necrosis) around your bladder or vulva.  Development of a hole between organs or from an organ to the surface of your skin (fistula).  Blood clot in the legs (deep vein thrombosis) or lung (pulmonary embolus). What happens before the procedure?  Ask your health care provider about changing or stopping your regular medicines.  Do not take aspirin or blood thinners (anticoagulants) for 1 week before the surgery or as directed by your health care provider.  Do not eat or drink anything for 8 hours  before the surgery or as directed by your health care provider.  Empty your bladder before the procedure begins. What happens during the procedure?  An IV tube will be placed into one of your veins. This will be used to give you a sedative and pain medication (conscious sedation).  You will be given a medicine that numbs the area (local anesthetic).  A small cut will be made in your groin. A catheter is then inserted into the main artery of your leg.  The catheter will be guided through the artery to your uterus. A series of images will be taken while dye is injected through the catheter in your groin. X-rays are taken at the same time. This is done to provide a road map of the blood supply to your uterus and fibroids.  Tiny plastic spheres, about the size of sand grains, will be injected through the catheter. Metal coils may be used to help block the artery. The particles will lodge in tiny branches of the uterine artery that supplies blood to the fibroids.  The procedure is repeated on the artery that supplies the other side of the uterus.  The catheter is then removed and pressure is held to stop any bleeding. No stitches are needed.  A dressing is then placed over the cut (incision). What happens after the procedure?  You will be taken to a recovery area where your progress will be monitored until you are awake, stable, and taking fluids well. If there are no other problems, you will then be moved to a regular hospital room.  You will be observed overnight in the hospital.  You will have cramping that should be controlled with pain medication. This information is not intended to replace advice given to you by your health care provider. Make sure you discuss any questions you have with your health care provider. Document Released: 04/01/2005 Document Revised: 06/22/2015 Document Reviewed: 07/30/2012 Elsevier Interactive Patient Education  [employer].

## 2017-07-30 ENCOUNTER — Telehealth: Payer: Self-pay | Admitting: *Deleted

## 2017-07-30 NOTE — Telephone Encounter (Signed)
Called the patient and gave the new patient appt for July 26th at [place] to the patient to arrive at 8:45am, informed her about valet and that she will have a pelvic exam.

## 2017-08-22 ENCOUNTER — Encounter: Payer: Self-pay | Admitting: Gynecologic Oncology

## 2017-08-22 ENCOUNTER — Inpatient Hospital Stay: Payer: BLUE CROSS/BLUE SHIELD | Attending: Gynecologic Oncology | Admitting: Gynecologic Oncology

## 2017-08-22 VITALS — BP 115/70 | HR 99 | Temp 98.8°F | Resp 20 | Ht 66.0 in | Wt 257.1 lb

## 2017-08-22 DIAGNOSIS — D219 Benign neoplasm of connective and other soft tissue, unspecified: Secondary | ICD-10-CM

## 2017-08-22 DIAGNOSIS — D259 Leiomyoma of uterus, unspecified: Secondary | ICD-10-CM | POA: Insufficient documentation

## 2017-08-22 NOTE — Patient Instructions (Addendum)
We will coordinate with Dr. Harolyn Rutherford who is the primary surgeon and we will be available to assist if needed.  Please call for any questions or concerns.

## 2017-08-22 NOTE — Progress Notes (Signed)
Consult Note: Gyn-Onc  Consult was requested by Dr. Harolyn Rutherford for the evaluation of Vanessa Elliott 42 y.o. female  CC:  Chief Complaint  Patient presents with  . Fibroids    Assessment/Plan:  Vanessa Elliott  is a 42 y.o.  year old with 30cm fibroid uterus (with one large dominant fibroid).  Her symptoms are predominantly mass-effect with no bleeding symptoms and she has a normal hemoglobin.  However due to the massive size of her dominant fibroid uterus, I agree with the plan for surgical removal.  We reviewed her imaging findings and I discussed with the patient the risks involved with hysterectomy with such a complex case.  I discussed one major risk is of intraoperative hemorrhage.  To mitigate this I agree with the plan for preoperative urine  artery embolization.  I would recommend type and crossing for several units of packed red blood cells and FFP and would recommend performing a surgery to site such as [facility] where access to blood products and ICU services is more readily available.  Additionally urologic and gynecologic assistance is more readily available in the setting.  Cell Saver might also be considered and needs to be specially ordered preoperatively with perfusionist available, but might be a good option for this patient.  I discussed the increased risk of damage to adjacent structures particularly adjacent pelvic structures such as the ureters and bladder and possibly the rectum due to the limited visualization of the vascular pedicles in the setting of a large fibroid uterus.  Based on her physical exam which showed somewhat normal to palpate cervix and some mobility of the lower uterine segment, I am optimistic that with cephalad traction on the uterine specimen they might be reasonable visualization laterally.  However I do agree with the plan to place intraoperative ureteral stents to aid in intraoperative identification of the ureters particularly if palpation is  relied upon due to limited visualization.  I will discuss surgical timing with Dr Harolyn Rutherford so that I can be available should she need me during the procedure.   HPI: Vanessa Elliott is a 42 year old woman who is seen in consultation at the request of Dr Harolyn Rutherford for a large fibroid uterus and surgical planning.  Patient has had a long-standing history of a gradually enlarging fibroid uterus.  In 2019 the symptoms of mass-effect became extreme enough that the patient desired surgical intervention.  MRI imaging revealed a uterus measuring 29.7 x 19.2 x 25.2 cm with a single large central uterine fibroid measuring 21.5 x 16.2 x 20.2 cm.  The fibroid was seen.  The cervix is grossly normal in appearance and apparently free of fibroid tumor.  The ovaries were not visualized due to the size of the fibroid.  The patient reports normal menstrual history and denies menorrhagia.  Her last hemoglobin was greater than 14 mg/dL.  Patient has not had prior abdominal surgeries.  She has had one prior vaginal delivery.  Her surgeon was anticipating a complex surgery and had considered Lupron administration preoperatively, however the patient declined this.  Her surgeon is planned for ureteral stent placement intraoperatively to aid in ureteral identification, and preoperative uterine artery embolization to decreased perfusion pressure to the uterus.  Current Meds:  No outpatient encounter medications on file as of 08/22/2017.   No facility-administered encounter medications on file as of 08/22/2017.     Allergy: No Known Allergies  Social Hx:   Social History   Socioeconomic History  . Marital status: Single  Spouse name: Not on file  . Number of children: Not on file  . Years of education: Not on file  . Highest education level: Not on file  Occupational History  . Not on file  Social Needs  . Financial resource strain: Not on file  . Food insecurity:    Worry: Not on file    Inability: Not on  file  . Transportation needs:    Medical: Not on file    Non-medical: Not on file  Tobacco Use  . Smoking status: Never Smoker  . Smokeless tobacco: Never Used  Substance and Sexual Activity  . Alcohol use: Not Currently  . Drug use: Not Currently  . Sexual activity: Not Currently  Lifestyle  . Physical activity:    Days per week: Not on file    Minutes per session: Not on file  . Stress: Not on file  Relationships  . Social connections:    Talks on phone: Not on file    Gets together: Not on file    Attends religious service: Not on file    Active member of club or organization: Not on file    Attends meetings of clubs or organizations: Not on file    Relationship status: Not on file  . Intimate partner violence:    Fear of current or ex partner: Not on file    Emotionally abused: Not on file    Physically abused: Not on file    Forced sexual activity: Not on file  Other Topics Concern  . Not on file  Social History Narrative  . Not on file    Past Surgical Hx: History reviewed. No pertinent surgical history.  Past Medical Hx:  Past Medical History:  Diagnosis Date  . Stomach ulcer   . Uterine fibroid     Past Gynecological History:  SVD x 1, fibroid uterus. No LMP recorded.  Family Hx:  Family History  Problem Relation Age of Onset  . Diabetes Maternal Grandmother     Review of Systems:  Constitutional  Feels well,    ENT Normal appearing ears and nares bilaterally Skin/Breast  No rash, sores, jaundice, itching, dryness Cardiovascular  No chest pain, shortness of breath, or edema  Pulmonary  No cough or wheeze.  Gastro Intestinal  No nausea, vomitting, or diarrhoea. No bright red blood per rectum, no abdominal pain, change in bowel movement, or constipation. + abdominal mass Genito Urinary  No frequency, urgency, dysuria,  Musculo Skeletal  No myalgia, arthralgia, joint swelling or pain  Neurologic  No weakness, numbness, change in gait,   Psychology  No depression, anxiety, insomnia.   Vitals:  Blood pressure 115/70, pulse 99, temperature 98.8 F (37.1 C), temperature source Oral, resp. rate 20, height 5\' 6"  (1.676 m), weight 257 lb 1.6 oz (116.6 kg), SpO2 100 %.  Physical Exam: WD in NAD Neck  Supple NROM, without any enlargements.  Lymph Node Survey No cervical supraclavicular or inguinal adenopathy Cardiovascular  Pulse normal rate, regularity and rhythm. S1 and S2 normal.  Lungs  Clear to auscultation bilateraly, without wheezes/crackles/rhonchi. Good air movement.  Skin  No rash/lesions/breakdown  Psychiatry  Alert and oriented to person, place, and time  Abdomen  Normoactive bowel sounds, abdomen soft, non-tender and overweight without evidence of hernia. Uterine fundus extends to the upper abdomen which is minimally mobile laterally as it spans from sidewall to sidewall. Back No CVA tenderness Genito Urinary  Vulva/vagina: Normal external female genitalia.  No lesions. No discharge  or bleeding.  Bladder/urethra:  No lesions or masses, well supported bladder  Vagina: normal  Cervix: Normal appearing, no lesions. Mobile.  Uterus: Large, 30cm fibroid uterus extending into the upper abdomen, the lower uterine segement is somewhat mobile, and the fibroid is relatively high in the pelvis, no parametrial involvement or nodularity.  Adnexa: no discrete masses. Rectal  deferred Extremities  No bilateral cyanosis, clubbing or edema.   Thereasa Solo, MD  08/22/2017, 12:11 PM

## 2017-08-25 ENCOUNTER — Other Ambulatory Visit: Payer: Self-pay | Admitting: Obstetrics & Gynecology

## 2017-08-25 DIAGNOSIS — D250 Submucous leiomyoma of uterus: Secondary | ICD-10-CM

## 2017-09-09 ENCOUNTER — Encounter (HOSPITAL_COMMUNITY): Payer: Self-pay

## 2017-09-11 ENCOUNTER — Ambulatory Visit
Admission: RE | Admit: 2017-09-11 | Discharge: 2017-09-11 | Disposition: A | Discharge status: Home / Self Care | Payer: BLUE CROSS/BLUE SHIELD | Source: Ambulatory Visit | Attending: Obstetrics & Gynecology | Admitting: Obstetrics & Gynecology

## 2017-09-11 DIAGNOSIS — D250 Submucous leiomyoma of uterus: Secondary | ICD-10-CM

## 2017-09-11 HISTORY — PX: IR RADIOLOGIST EVAL & MGMT: IMG5224

## 2017-09-11 NOTE — Consult Note (Signed)
Chief Complaint: Patient was seen in consultation today for  Chief Complaint  Patient presents with  . Consult    Consult for [place]     at the request of [name],[name]  Referring Physician(s): [name],[name]  History of Present Illness: Vanessa Elliott is a 42 y.o. female who presents for consultation regarding a large uterine fibroid with request for preop embolization.  Patient was diagnosed with her fibroid many years ago.  It is grown to quite significant side greater than 21 cm.  She is having some urinary frequency present.  No abnormal uterine bleeding, or enter.  Bleeding.  Her cycles are monthly lasting 5 days with no particularly heavy days.  No intra-.  Bleeding.  No other significant bulk symptoms besides abdominal protrusion. She is G1, P1 with no plans for future pregnancy.  No perimenopausal symptoms.  Past Medical History:  Diagnosis Date  . Stomach ulcer   . Uterine fibroid     No past surgical history on file.  Allergies: Patient has no known allergies.  Medications: Prior to Admission medications   Medication Sig Start Date End Date Taking? Authorizing Provider  ferrous sulfate 325 (65 FE) MG EC tablet Take 325 mg by mouth daily.   Yes [provider]  Multiple Vitamin (MULTIVITAMIN) tablet Take 1 tablet by mouth daily.   Yes [provider]     Family History  Problem Relation Age of Onset  . Diabetes Maternal Grandmother     Social History   Socioeconomic History  . Marital status: Single    Spouse name: Not on file  . Number of children: Not on file  . Years of education: Not on file  . Highest education level: Not on file  Occupational History  . Not on file  Social Needs  . Financial resource strain: Not on file  . Food insecurity:    Worry: Not on file    Inability: Not on file  . Transportation needs:    Medical: Not on file    Non-medical: Not on file  Tobacco Use  . Smoking status: Never Smoker  .  Smokeless tobacco: Never Used  Substance and Sexual Activity  . Alcohol use: Not Currently  . Drug use: Not Currently  . Sexual activity: Not Currently  Lifestyle  . Physical activity:    Days per week: Not on file    Minutes per session: Not on file  . Stress: Not on file  Relationships  . Social connections:    Talks on phone: Not on file    Gets together: Not on file    Attends religious service: Not on file    Active member of club or organization: Not on file    Attends meetings of clubs or organizations: Not on file    Relationship status: Not on file  Other Topics Concern  . Not on file  Social History Narrative  . Not on file    ECOG Status: 1 - Symptomatic but completely ambulatory  Review of Systems  Review of Systems: A 12 point ROS discussed and pertinent positives are indicated in the HPI above.  All other systems are negative.   Physical Exam  Vital Signs: Ht 5\' 6"  (1.676 m)   Wt 116.6 kg   LMP 09/04/2017 (Exact Date)   BMI 41.48 kg/m   Constitutional: Oriented to person, place, and time.  Obese, well-developed and well-nourished. No distress.  Last Weight  Most recent update:  09/11/2017  2:59 PM   Weight  116.6 kg (257 lb)           HENT:  Head: Normocephalic and atraumatic.  Eyes: Conjunctivae and EOM are normal. Right eye exhibits no discharge. Left eye exhibits no discharge. No scleral icterus.  Neck: No JVD present.  Pulmonary/Chest: Effort normal. No stridor. No respiratory distress.  Abdomen: soft, protuberant, non distended Neurological:  alert and oriented to person, place, and time.  Skin: Skin is warm and dry.  not diaphoretic.  Psychiatric:   normal mood and affect.   behavior is normal. Judgment and thought content normal.  Mallampati Score:     Imaging:  MRI PELVIS WITHOUT AND WITH CONTRAST  TECHNIQUE: Multiplanar multisequence MR imaging of the pelvis was performed both before and after administration of intravenous  contrast.  CONTRAST:  3mL MULTIHANCE GADOBENATE DIMEGLUMINE 529 MG/ML IV SOLN  COMPARISON:  None.  FINDINGS: Urinary Tract: No urinary bladder or urethral abnormality.  Bowel: Unremarkable pelvic bowel loops.  Vascular/Lymphatic: Unremarkable. No pathologically enlarged pelvic lymph nodes identified.  Reproductive:  -- Uterus: Measures 29.7 x 19.2 by 25.2 cm (volume = 7520 cm^3). A single large central uterine fibroid is seen which measures 21.5 x 16.2 x 20.2 cm (volume = 3680 cm^3). This fibroid shows diffuse heterogeneous contrast enhancement. No other fibroids identified. Cervix is normal in appearance.  -- Right ovary: Not visualized, however no adnexal mass identified.  -- Left ovary:  Not visualized, however no adnexal mass identified.  Other: No peritoneal thickening or abnormal free fluid. No evidence of hydronephrosis.  Musculoskeletal:  Unremarkable.  IMPRESSION: Markedly enlarged uterus, with single central fibroid measuring 21.5 cm.  Nonvisualization of ovaries, however no adnexal mass identified.   Electronically Signed   By: Earle Gell M.D.   On: 05/02/2017 11:25   Labs:  CBC: Recent Labs    04/22/17 1645  WBC 4.2  HGB 14.1  HCT 43.3  PLT 148*    COAGS: No results for input(s): INR, APTT in the last 8760 hours.  BMP: Recent Labs    04/22/17 1645  NA 140  K 3.7  CL 102  CO2 22  GLUCOSE 81  BUN 10  CALCIUM 9.2  CREATININE 0.88  GFRNONAA 82  GFRAA 94    LIVER FUNCTION TESTS: Recent Labs    04/22/17 1645  BILITOT 0.4  AST 13  ALT 9  ALKPHOS 76  PROT 7.7  ALBUMIN 4.6      Assessment and Plan:  My impression is that this patient is urinary frequency is certainly related to her massive uterine fibroid and associated bulk symptoms.  We spent the majority of the consultation discussing the pathophysiology of uterine leiomyomata, natural history, anticipated  involution post menopause, and treatment  options. We discussed myomectomy, hysterectomy, and uterine fibroid embolization. I described the technique of UFE, historical use for preop control of bleeding, anticipated benefits, possible risks and complications including but not limited to bleeding, infection, vessel damage, nontarget embolization, and incomplete symptom relief. We discussed the 80-90% clinical success rate historically and at our experience with stand-alone UFE as fibroid treatment. We discussed the post procedure course and time course of symptom resolution.  We discussed fibroid embolization and the fact that a large fibroid such as hers, although it may involute up to 50% in volume, will still remain significant in size.  We discussed the need for continued gynecologic care.  She seemed to understand and did ask appropriate questions, which were answered.  Based on her evaluation thus far, I think she would be an appropriate candidate for uterine fibroid embolization because of her symptomatology and  uterine fibroids.  MR demonstrates no pedunculated subserosal or submucosal fibroid.   The embolization procedure could be performed within 24 hours preop for primary indication of hemostasis.  After our discussion, the patient was motivated proceed. Accordingly, we can schedule this in coordination with her surgical planning. Thank you for this interesting consult.  I greatly enjoyed meeting Tylyn Stankovich and look forward to participating in their care.  A copy of this report was sent to the requesting provider on this date.  Electronically Signed: Rickard Rhymes 09/11/2017, 3:50 PM   I spent a total of  40 Minutes   in face to face in clinical consultation, greater than 50% of which was counseling/coordinating care for symptomatic uterine fibroid.

## 2017-09-17 ENCOUNTER — Other Ambulatory Visit (HOSPITAL_COMMUNITY): Payer: Self-pay | Admitting: Interventional Radiology

## 2017-09-17 DIAGNOSIS — D250 Submucous leiomyoma of uterus: Secondary | ICD-10-CM

## 2017-09-18 ENCOUNTER — Encounter (HOSPITAL_COMMUNITY): Payer: Self-pay

## 2017-09-18 ENCOUNTER — Encounter: Payer: Self-pay | Admitting: *Deleted

## 2017-09-22 ENCOUNTER — Other Ambulatory Visit: Payer: Self-pay | Admitting: Urology

## 2017-10-01 ENCOUNTER — Other Ambulatory Visit (HOSPITAL_COMMUNITY): Payer: BLUE CROSS/BLUE SHIELD

## 2017-10-02 ENCOUNTER — Inpatient Hospital Stay: Admit: 2017-10-02 | Payer: BLUE CROSS/BLUE SHIELD | Admitting: Obstetrics & Gynecology

## 2017-10-02 SURGERY — HYSTERECTOMY, TOTAL, ABDOMINAL, WITH SALPINGECTOMY
Anesthesia: Choice | Laterality: Bilateral

## 2017-10-13 ENCOUNTER — Other Ambulatory Visit (HOSPITAL_COMMUNITY): Payer: Self-pay | Admitting: Interventional Radiology

## 2017-10-13 DIAGNOSIS — D250 Submucous leiomyoma of uterus: Secondary | ICD-10-CM

## 2017-10-14 ENCOUNTER — Encounter (HOSPITAL_COMMUNITY): Payer: Self-pay

## 2017-10-21 ENCOUNTER — Encounter: Payer: Self-pay | Admitting: Radiology

## 2017-10-21 ENCOUNTER — Ambulatory Visit
Admission: RE | Admit: 2017-10-21 | Discharge: 2017-10-21 | Disposition: A | Discharge status: Home / Self Care | Payer: BLUE CROSS/BLUE SHIELD | Source: Ambulatory Visit | Attending: Interventional Radiology | Admitting: Interventional Radiology

## 2017-10-21 DIAGNOSIS — D250 Submucous leiomyoma of uterus: Secondary | ICD-10-CM

## 2017-10-21 HISTORY — PX: IR RADIOLOGIST EVAL & MGMT: IMG5224

## 2017-10-21 NOTE — Progress Notes (Signed)
Chief Complaint: Discussion of pre-operative uterine artery embolization.  History of Present Illness: Vanessa Elliott is a 42 y.o. female who was previously seen by Dr. Vernard Gambles on 09/11/2017 to discuss preoperative embolization prior to planned hysterectomy for a giant central enlarging mass of the uterus presumed to represent a giant fibroid.  After coordination of procedures, preoperative embolization has been scheduled for 11/10/2017 and surgery on 11/11/2017 at [facility].  The patient will be admitted following the embolization procedure.  Vanessa Elliott returns today strictly for counseling regarding the upcoming procedure.  Past Medical History:  Diagnosis Date  . Stomach ulcer   . Uterine fibroid     Past Surgical History:  Procedure Laterality Date  . IR RADIOLOGIST EVAL & MGMT  09/11/2017  . IR RADIOLOGIST EVAL & MGMT  10/21/2017    Allergies: Patient has no known allergies.  Medications: Prior to Admission medications   Medication Sig Start Date End Date Taking? Authorizing Provider  ferrous sulfate 325 (65 FE) MG EC tablet Take 325 mg by mouth daily.    [provider]  Multiple Vitamin (MULTIVITAMIN) tablet Take 1 tablet by mouth daily.    [provider]     Family History  Problem Relation Age of Onset  . Diabetes Maternal Grandmother     Social History   Socioeconomic History  . Marital status: Single    Spouse name: Not on file  . Number of children: Not on file  . Years of education: Not on file  . Highest education level: Not on file  Occupational History  . Not on file  Social Needs  . Financial resource strain: Not on file  . Food insecurity:    Worry: Not on file    Inability: Not on file  . Transportation needs:    Medical: Not on file    Non-medical: Not on file  Tobacco Use  . Smoking status: Never Smoker  . Smokeless tobacco: Never Used  Substance and Sexual Activity  . Alcohol use: Not Currently    . Drug use: Not Currently  . Sexual activity: Not Currently  Lifestyle  . Physical activity:    Days per week: Not on file    Minutes per session: Not on file  . Stress: Not on file  Relationships  . Social connections:    Talks on phone: Not on file    Gets together: Not on file    Attends religious service: Not on file    Active member of club or organization: Not on file    Attends meetings of clubs or organizations: Not on file    Relationship status: Not on file  Other Topics Concern  . Not on file  Social History Narrative  . Not on file     Vital Signs: BP 119/73   Pulse 92   Temp 99.3 F (37.4 C) (Oral)   Resp 15   LMP 09/29/2017   SpO2 99%    Labs:  CBC: Recent Labs    04/22/17 1645  WBC 4.2  HGB 14.1  HCT 43.3  PLT 148*    COAGS: No results for input(s): INR, APTT in the last 8760 hours.  BMP: Recent Labs    04/22/17 1645  NA 140  K 3.7  CL 102  CO2 22  GLUCOSE 81  BUN 10  CALCIUM 9.2  CREATININE 0.88  GFRNONAA 82  GFRAA 94    LIVER FUNCTION TESTS: Recent Labs    04/22/17  1645  BILITOT 0.4  AST 13  ALT 9  ALKPHOS 76  PROT 7.7  ALBUMIN 4.6     Assessment and Plan:  Since I will be performing preoperative embolization and not Dr. Vernard Gambles, who she had seen previously, I met with Ms. Voytko to answer any question she may have.  We again reviewed details of uterine artery embolization and timing of procedures with her upcoming hospitalization.  After embolization the morning of 11/10/2017, she will be admitted.  She will likely need some symptomatic treatment of post embolization cramping due to inflammation induced after embolization.  Endpoint of embolization will not be quite to the level of stasis of flow as a stand-alone embolization procedure due to planned surgery the next day.  In addition, due to the size and vascularity of the large central uterine mass, it would be very difficult to achieve such an endpoint.  The  embolization procedure will be performed with IV moderate conscious sedation.   All of [name]'s questions were answered today.   Electronically SignedAletta Edouard T 10/21/2017, 5:49 PM     I spent a total of 15 Minutes in face to face in clinical consultation, greater than 50% of which was counseling/coordinating care for uterine artery embolization.

## 2017-11-05 NOTE — Patient Instructions (Addendum)
Shima Compere  11/05/2017   Your procedure is scheduled on: 11-11-17     Report to [facility] Main  Entrance    Report to Admitting at 7:30 AM    Call this number if you have problems the morning of surgery [phone]     Remember: Do not eat food or drink liquids :After Midnight.    BRUSH YOUR TEETH MORNING OF SURGERY AND RINSE YOUR MOUTH OUT, NO CHEWING GUM CANDY OR MINTS.     Take these medicines the morning of surgery with A SIP OF WATER: None                                You may not have any metal on your body including hair pins and              piercings  Do not wear jewelry, make-up, lotions, powders or perfumes, deodorant             Do not wear nail polish.  Do not shave  48 hours prior to surgery.          Do not bring valuables to the hospital. [place].  Contacts, dentures or bridgework may not be worn into surgery.  Leave suitcase in the car. After surgery it may be brought to your room.     Special Instructions: N/A              Please read over the following fact sheets you were given: _____________________________________________________________________             [facility] - Preparing for Surgery Before surgery, you can play an important role.  Because skin is not sterile, your skin needs to be as free of germs as possible.  You can reduce the number of germs on your skin by washing with CHG (chlorahexidine gluconate) soap before surgery.  CHG is an antiseptic cleaner which kills germs and bonds with the skin to continue killing germs even after washing. Please DO NOT use if you have an allergy to CHG or antibacterial soaps.  If your skin becomes reddened/irritated stop using the CHG and inform your nurse when you arrive at Short Stay. Do not shave (including legs and underarms) for at least 48 hours prior to the first CHG shower.  You may shave your face/neck. Please  follow these instructions carefully:  1.  Shower with CHG Soap the night before surgery and the  morning of Surgery.  2.  If you choose to wash your hair, wash your hair first as usual with your  normal  shampoo.  3.  After you shampoo, rinse your hair and body thoroughly to remove the  shampoo.                           4.  Use CHG as you would any other liquid soap.  You can apply chg directly  to the skin and wash                       Gently with a scrungie or clean washcloth.  5.  Apply the CHG Soap to your body ONLY FROM THE NECK  DOWN.   Do not use on face/ open                           Wound or open sores. Avoid contact with eyes, ears mouth and genitals (private parts).                       Wash face,  Genitals (private parts) with your normal soap.             6.  Wash thoroughly, paying special attention to the area where your surgery  will be performed.  7.  Thoroughly rinse your body with warm water from the neck down.  8.  DO NOT shower/wash with your normal soap after using and rinsing off  the CHG Soap.                9.  Pat yourself dry with a clean towel.            10.  Wear clean pajamas.            11.  Place clean sheets on your bed the night of your first shower and do not  sleep with pets. Day of Surgery : Do not apply any lotions/deodorants the morning of surgery.  Please wear clean clothes to the hospital/surgery center.  FAILURE TO FOLLOW THESE INSTRUCTIONS MAY RESULT IN THE CANCELLATION OF YOUR SURGERY PATIENT SIGNATURE_________________________________  NURSE SIGNATURE__________________________________  ________________________________________________________________________

## 2017-11-06 ENCOUNTER — Other Ambulatory Visit: Payer: Self-pay | Admitting: Radiology

## 2017-11-07 ENCOUNTER — Other Ambulatory Visit: Payer: Self-pay

## 2017-11-07 ENCOUNTER — Encounter (HOSPITAL_COMMUNITY)
Admission: RE | Admit: 2017-11-07 | Discharge: 2017-11-07 | Disposition: A | Discharge status: Home / Self Care | Payer: BLUE CROSS/BLUE SHIELD | Source: Ambulatory Visit | Attending: Obstetrics & Gynecology | Admitting: Obstetrics & Gynecology

## 2017-11-07 ENCOUNTER — Encounter (HOSPITAL_COMMUNITY): Payer: Self-pay

## 2017-11-07 DIAGNOSIS — Z01812 Encounter for preprocedural laboratory examination: Secondary | ICD-10-CM | POA: Insufficient documentation

## 2017-11-07 LAB — COMPREHENSIVE METABOLIC PANEL
ALK PHOS: 54 U/L (ref 38–126)
ALT: 12 U/L (ref 0–44)
ANION GAP: 9 (ref 5–15)
AST: 17 U/L (ref 15–41)
Albumin: 4 g/dL (ref 3.5–5.0)
BILIRUBIN TOTAL: 0.4 mg/dL (ref 0.3–1.2)
BUN: 13 mg/dL (ref 6–20)
CALCIUM: 8.8 mg/dL — AB (ref 8.9–10.3)
CO2: 25 mmol/L (ref 22–32)
Chloride: 106 mmol/L (ref 98–111)
Creatinine, Ser: 0.71 mg/dL (ref 0.44–1.00)
GFR calc Af Amer: >60 mL/min (ref >60)
GLUCOSE: 88 mg/dL (ref 70–99)
Potassium: 3.7 mmol/L (ref 3.5–5.1)
Sodium: 140 mmol/L (ref 135–145)
TOTAL PROTEIN: 7.3 g/dL (ref 6.5–8.1)

## 2017-11-07 LAB — CBC WITH DIFFERENTIAL/PLATELET
ABS IMMATURE GRANULOCYTES: 0.01 10*3/uL (ref 0.00–0.07)
BASOS PCT: 0 %
Basophils Absolute: 0 10*3/uL (ref 0.0–0.1)
EOS PCT: 2 %
Eosinophils Absolute: 0.1 10*3/uL (ref 0.0–0.5)
HCT: 43 % (ref 36.0–46.0)
HEMOGLOBIN: 13.4 g/dL (ref 12.0–15.0)
Immature Granulocytes: 0 %
LYMPHS PCT: 37 %
Lymphs Abs: 1.5 10*3/uL (ref 0.7–4.0)
MCH: 28.3 pg (ref 26.0–34.0)
MCHC: 31.2 g/dL (ref 30.0–36.0)
MCV: 90.9 fL (ref 80.0–100.0)
MONO ABS: 0.4 10*3/uL (ref 0.1–1.0)
MONOS PCT: 11 %
NEUTROS ABS: 2 10*3/uL (ref 1.7–7.7)
Neutrophils Relative %: 50 %
Platelets: 146 10*3/uL — ABNORMAL LOW (ref 150–400)
RBC: 4.73 MIL/uL (ref 3.87–5.11)
RDW: 13.6 % (ref 11.5–15.5)
WBC: 4 10*3/uL (ref 4.0–10.5)
nRBC: 0 % (ref 0.0–0.2)

## 2017-11-07 LAB — PROTIME-INR
INR: 0.98
Prothrombin Time: 12.9 seconds (ref 11.4–15.2)

## 2017-11-07 LAB — HCG, SERUM, QUALITATIVE: PREG SERUM: NEGATIVE

## 2017-11-08 LAB — ABO/RH: ABO/RH(D): O POS

## 2017-11-10 ENCOUNTER — Encounter (HOSPITAL_COMMUNITY): Payer: Self-pay

## 2017-11-10 ENCOUNTER — Other Ambulatory Visit: Payer: Self-pay

## 2017-11-10 ENCOUNTER — Ambulatory Visit (HOSPITAL_COMMUNITY)
Admission: RE | Admit: 2017-11-10 | Discharge: 2017-11-10 | Disposition: A | Discharge status: Home / Self Care | Payer: BLUE CROSS/BLUE SHIELD | Source: Ambulatory Visit | Attending: Obstetrics & Gynecology | Admitting: Obstetrics & Gynecology

## 2017-11-10 ENCOUNTER — Inpatient Hospital Stay (HOSPITAL_COMMUNITY)
Admission: AD | Admit: 2017-11-10 | Discharge: 2017-11-14 | DRG: 742 | Disposition: A | Discharge status: Home / Self Care | Payer: BLUE CROSS/BLUE SHIELD | Attending: Obstetrics & Gynecology | Admitting: Obstetrics & Gynecology

## 2017-11-10 VITALS — BP 119/70 | HR 78 | Temp 98.6°F | Resp 18 | Ht 66.0 in | Wt 245.5 lb

## 2017-11-10 DIAGNOSIS — E669 Obesity, unspecified: Secondary | ICD-10-CM | POA: Diagnosis present

## 2017-11-10 DIAGNOSIS — D251 Intramural leiomyoma of uterus: Secondary | ICD-10-CM

## 2017-11-10 DIAGNOSIS — Z9889 Other specified postprocedural states: Secondary | ICD-10-CM

## 2017-11-10 DIAGNOSIS — N858 Other specified noninflammatory disorders of uterus: Secondary | ICD-10-CM

## 2017-11-10 DIAGNOSIS — N131 Hydronephrosis with ureteral stricture, not elsewhere classified: Secondary | ICD-10-CM | POA: Diagnosis present

## 2017-11-10 DIAGNOSIS — D62 Acute posthemorrhagic anemia: Secondary | ICD-10-CM | POA: Diagnosis not present

## 2017-11-10 DIAGNOSIS — Q625 Duplication of ureter: Secondary | ICD-10-CM

## 2017-11-10 DIAGNOSIS — Z6841 Body Mass Index (BMI) 40.0 and over, adult: Secondary | ICD-10-CM | POA: Diagnosis not present

## 2017-11-10 DIAGNOSIS — D259 Leiomyoma of uterus, unspecified: Secondary | ICD-10-CM | POA: Diagnosis present

## 2017-11-10 DIAGNOSIS — D250 Submucous leiomyoma of uterus: Secondary | ICD-10-CM

## 2017-11-10 DIAGNOSIS — N939 Abnormal uterine and vaginal bleeding, unspecified: Secondary | ICD-10-CM | POA: Diagnosis present

## 2017-11-10 DIAGNOSIS — Z90710 Acquired absence of both cervix and uterus: Secondary | ICD-10-CM | POA: Diagnosis present

## 2017-11-10 HISTORY — PX: IR ANGIOGRAM SELECTIVE EACH ADDITIONAL VESSEL: IMG667

## 2017-11-10 HISTORY — DX: Duplication of ureter: Q62.5

## 2017-11-10 HISTORY — PX: IR ANGIOGRAM PELVIS SELECTIVE OR SUPRASELECTIVE: IMG661

## 2017-11-10 HISTORY — PX: IR US GUIDE VASC ACCESS RIGHT: IMG2390

## 2017-11-10 HISTORY — PX: IR EMBO TUMOR ORGAN ISCHEMIA INFARCT INC GUIDE ROADMAPPING: IMG5449

## 2017-11-10 LAB — SURGICAL PCR SCREEN
MRSA, PCR: NEGATIVE
Staphylococcus aureus: NEGATIVE

## 2017-11-10 LAB — PREPARE RBC (CROSSMATCH)

## 2017-11-10 MED ORDER — IOHEXOL 300 MG/ML  SOLN
100.0000 mL | Freq: Once | INTRAMUSCULAR | Status: AC | PRN
  Administered 2017-11-10: 65 mL via INTRA_ARTERIAL

## 2017-11-10 MED ORDER — MAGNESIUM CITRATE PO SOLN: 1.0000 | Freq: Once | ORAL | Status: DC | PRN

## 2017-11-10 MED ORDER — DOCUSATE SODIUM 100 MG PO CAPS
100.0000 mg | ORAL_CAPSULE | Freq: Two times a day (BID) | ORAL | Status: DC
  Administered 2017-11-10: 100 mg via ORAL
  Filled 2017-11-10: qty 1

## 2017-11-10 MED ORDER — MAGNESIUM HYDROXIDE 400 MG/5ML PO SUSP: 30.0000 mL | Freq: Every day | ORAL | Status: DC | PRN

## 2017-11-10 MED ORDER — IOHEXOL 300 MG/ML  SOLN
100.0000 mL | Freq: Once | INTRAMUSCULAR | Status: AC | PRN
  Administered 2017-11-10: 20 mL via INTRA_ARTERIAL

## 2017-11-10 MED ORDER — DOCUSATE SODIUM 100 MG PO CAPS: 100.0000 mg | ORAL_CAPSULE | Freq: Two times a day (BID) | ORAL | Status: DC

## 2017-11-10 MED ORDER — DIPHENHYDRAMINE HCL 12.5 MG/5ML PO ELIX
12.5000 mg | ORAL_SOLUTION | Freq: Four times a day (QID) | ORAL | Status: DC | PRN
  Filled 2017-11-10: qty 5

## 2017-11-10 MED ORDER — LACTATED RINGERS IV SOLN: INTRAVENOUS | Status: DC

## 2017-11-10 MED ORDER — SODIUM CHLORIDE 0.9% FLUSH: 9.0000 mL | INTRAVENOUS | Status: DC | PRN

## 2017-11-10 MED ORDER — FENTANYL CITRATE (PF) 100 MCG/2ML IJ SOLN
INTRAMUSCULAR | Status: AC | PRN
  Administered 2017-11-10 (×4): 50 ug via INTRAVENOUS

## 2017-11-10 MED ORDER — OXYCODONE-ACETAMINOPHEN 5-325 MG PO TABS: 1.0000 | ORAL_TABLET | ORAL | Status: DC | PRN

## 2017-11-10 MED ORDER — SODIUM CHLORIDE 0.9% FLUSH: 3.0000 mL | INTRAVENOUS | Status: DC | PRN

## 2017-11-10 MED ORDER — ONDANSETRON HCL 4 MG/2ML IJ SOLN: 4.0000 mg | Freq: Four times a day (QID) | INTRAMUSCULAR | Status: DC | PRN

## 2017-11-10 MED ORDER — ACETAMINOPHEN 500 MG PO TABS: 1000.0000 mg | ORAL_TABLET | Freq: Four times a day (QID) | ORAL | Status: DC | PRN

## 2017-11-10 MED ORDER — FENTANYL CITRATE (PF) 100 MCG/2ML IJ SOLN
INTRAMUSCULAR | Status: AC
  Filled 2017-11-10: qty 4

## 2017-11-10 MED ORDER — SODIUM CHLORIDE 0.9% FLUSH: 3.0000 mL | Freq: Two times a day (BID) | INTRAVENOUS | Status: DC

## 2017-11-10 MED ORDER — ONDANSETRON HCL 4 MG/2ML IJ SOLN
4.0000 mg | Freq: Four times a day (QID) | INTRAMUSCULAR | Status: DC | PRN
  Administered 2017-11-11: 4 mg via INTRAVENOUS

## 2017-11-10 MED ORDER — HYDROMORPHONE HCL 1 MG/ML IJ SOLN: 1.0000 mg | INTRAMUSCULAR | Status: DC | PRN

## 2017-11-10 MED ORDER — BISACODYL 5 MG PO TBEC: 5.0000 mg | DELAYED_RELEASE_TABLET | Freq: Every day | ORAL | Status: DC | PRN

## 2017-11-10 MED ORDER — DOCUSATE SODIUM 100 MG PO CAPS: 100.0000 mg | ORAL_CAPSULE | Freq: Two times a day (BID) | ORAL | Status: DC | PRN

## 2017-11-10 MED ORDER — DIPHENHYDRAMINE HCL 50 MG/ML IJ SOLN: 12.5000 mg | Freq: Four times a day (QID) | INTRAMUSCULAR | Status: DC | PRN

## 2017-11-10 MED ORDER — ALUM & MAG HYDROXIDE-SIMETH 200-200-20 MG/5ML PO SUSP: 30.0000 mL | ORAL | Status: DC | PRN

## 2017-11-10 MED ORDER — SODIUM CHLORIDE 0.9 % IV SOLN
INTRAVENOUS | Status: DC
  Administered 2017-11-10: 07:00:00 via INTRAVENOUS

## 2017-11-10 MED ORDER — CEFAZOLIN SODIUM-DEXTROSE 2-4 GM/100ML-% IV SOLN
2.0000 g | INTRAVENOUS | Status: AC
  Administered 2017-11-10: 2 g via INTRAVENOUS

## 2017-11-10 MED ORDER — SOD CITRATE-CITRIC ACID 500-334 MG/5ML PO SOLN
30.0000 mL | ORAL | Status: DC
  Filled 2017-11-10: qty 30

## 2017-11-10 MED ORDER — MIDAZOLAM HCL 2 MG/2ML IJ SOLN
INTRAMUSCULAR | Status: AC | PRN
  Administered 2017-11-10 (×6): 1 mg via INTRAVENOUS

## 2017-11-10 MED ORDER — CEFAZOLIN SODIUM-DEXTROSE 2-4 GM/100ML-% IV SOLN
INTRAVENOUS | Status: AC
  Administered 2017-11-10: 2 g via INTRAVENOUS
  Filled 2017-11-10: qty 100

## 2017-11-10 MED ORDER — KETOROLAC TROMETHAMINE 30 MG/ML IJ SOLN
30.0000 mg | INTRAMUSCULAR | Status: AC
  Administered 2017-11-10: 30 mg via INTRAVENOUS
  Filled 2017-11-10: qty 1

## 2017-11-10 MED ORDER — IOHEXOL 300 MG/ML  SOLN
100.0000 mL | Freq: Once | INTRAMUSCULAR | Status: AC | PRN
  Administered 2017-11-10: 80 mL via INTRA_ARTERIAL

## 2017-11-10 MED ORDER — GELATIN ABSORBABLE 12-7 MM EX MISC
CUTANEOUS | Status: AC | PRN
  Administered 2017-11-10 (×2): 1 via TOPICAL

## 2017-11-10 MED ORDER — LIDOCAINE HCL 1 % IJ SOLN
INTRAMUSCULAR | Status: AC
  Filled 2017-11-10: qty 20

## 2017-11-10 MED ORDER — PROMETHAZINE HCL 25 MG PO TABS
25.0000 mg | ORAL_TABLET | Freq: Three times a day (TID) | ORAL | Status: DC | PRN
  Filled 2017-11-10: qty 1

## 2017-11-10 MED ORDER — IOHEXOL 300 MG/ML  SOLN
100.0000 mL | Freq: Once | INTRAMUSCULAR | Status: AC | PRN
  Administered 2017-11-10: 60 mL via INTRA_ARTERIAL

## 2017-11-10 MED ORDER — ONDANSETRON HCL 4 MG/2ML IJ SOLN
4.0000 mg | Freq: Four times a day (QID) | INTRAMUSCULAR | Status: DC | PRN
  Administered 2017-11-10: 4 mg via INTRAVENOUS
  Filled 2017-11-10: qty 2

## 2017-11-10 MED ORDER — PROMETHAZINE HCL 25 MG RE SUPP
25.0000 mg | Freq: Three times a day (TID) | RECTAL | Status: DC | PRN
  Filled 2017-11-10: qty 1

## 2017-11-10 MED ORDER — ONDANSETRON HCL 4 MG PO TABS: 4.0000 mg | ORAL_TABLET | Freq: Four times a day (QID) | ORAL | Status: DC | PRN

## 2017-11-10 MED ORDER — ZOLPIDEM TARTRATE 5 MG PO TABS: 5.0000 mg | ORAL_TABLET | Freq: Every evening | ORAL | Status: DC | PRN

## 2017-11-10 MED ORDER — GELATIN ABSORBABLE 12-7 MM EX MISC
CUTANEOUS | Status: AC
  Filled 2017-11-10: qty 1

## 2017-11-10 MED ORDER — MIDAZOLAM HCL 2 MG/2ML IJ SOLN
INTRAMUSCULAR | Status: AC
  Filled 2017-11-10: qty 6

## 2017-11-10 MED ORDER — SODIUM CHLORIDE 0.9 % IV SOLN
250.0000 mL | INTRAVENOUS | Status: DC | PRN
  Administered 2017-11-10: 250 mL via INTRAVENOUS

## 2017-11-10 MED ORDER — HYDROMORPHONE 1 MG/ML IV SOLN
INTRAVENOUS | Status: DC
  Administered 2017-11-10: 10:00:00 via INTRAVENOUS
  Administered 2017-11-10 (×3): 0 mg via INTRAVENOUS
  Filled 2017-11-10 (×2): qty 25

## 2017-11-10 MED ORDER — NALOXONE HCL 0.4 MG/ML IJ SOLN: 0.4000 mg | INTRAMUSCULAR | Status: DC | PRN

## 2017-11-10 MED ORDER — SODIUM CHLORIDE 0.9 % IV SOLN
2.0000 g | INTRAVENOUS | Status: AC
  Administered 2017-11-11 (×2): 2 g via INTRAVENOUS
  Filled 2017-11-10 (×2): qty 2

## 2017-11-10 NOTE — Anesthesia Preprocedure Evaluation (Addendum)
Anesthesia Evaluation  Patient identified by MRN, date of birth, ID band Patient awake    Reviewed: Allergy & Precautions, NPO status , Patient's Chart, lab work & pertinent test results  Airway Mallampati: II  TM Distance: >3 FB Neck ROM: Full    Dental  (+) Dental Advisory Given   Pulmonary neg pulmonary ROS,    breath sounds clear to auscultation       Cardiovascular negative cardio ROS   Rhythm:Regular Rate:Normal     Neuro/Psych negative neurological ROS     GI/Hepatic Neg liver ROS, PUD,   Endo/Other  negative endocrine ROS  Renal/GU negative Renal ROS     Musculoskeletal   Abdominal   Peds  Hematology negative hematology ROS (+)   Anesthesia Other Findings   Reproductive/Obstetrics Large uterine fibroid s/p embolization of bilateral uterine arteries.                            Lab Results  Component Value Date   WBC 4.6 11/11/2017   HGB 12.8 11/11/2017   HCT 41.7 11/11/2017   MCV 92.9 11/11/2017   PLT 120 (L) 11/11/2017   Lab Results  Component Value Date   CREATININE 0.72 11/11/2017   BUN 9 11/11/2017   NA 140 11/11/2017   K 3.7 11/11/2017   CL 110 11/11/2017   CO2 21 (L) 11/11/2017    Anesthesia Physical Anesthesia Plan  ASA: III  Anesthesia Plan: General   Post-op Pain Management:    Induction: Intravenous  PONV Risk Score and Plan: 3 and Dexamethasone, Ondansetron and Treatment may vary due to age or medical condition  Airway Management Planned:   Additional Equipment: Arterial line  Intra-op Plan:   Post-operative Plan: Extubation in OR and Possible Post-op intubation/ventilation  Informed Consent: I have reviewed the patients History and Physical, chart, labs and discussed the procedure including the risks, benefits and alternatives for the proposed anesthesia with the patient or authorized representative who has indicated his/her understanding and  acceptance.   Dental advisory given  Plan Discussed with: CRNA  Anesthesia Plan Comments: (2 large IV's. Aline. GETA. Type and cross.)       Anesthesia Quick Evaluation

## 2017-11-10 NOTE — Procedures (Signed)
Interventional Radiology Procedure Note  Procedure: Bilateral internal and uterine arteriography; bilateral uterine artery embolization  Complications: None  Estimated Blood Loss: < 10 mL  Findings: Huge bilateral uterine arteries supplying large trunks to uterus and dominant uterine mass/fibroid.  Both uterine arteries embolized with one vial 500-700 micron Embospheres, 3 vials 700-900 micron Embospheres and one sheet of cut Gelfoam pledgets.  Able to significantly slow flow in uterine trunk and branches supplying the dominant fibroid.  Plan: Admission for hysterectomy tomorrow.  Will treat any post-embolization pain today/tonight.  Venetia Night. Kathlene Cote, M.D Pager:  [phone]

## 2017-11-10 NOTE — H&P (Signed)
Referring Physician(s): [name],U  Supervising Physician: Aletta Edouard  Patient Status:  [facility] OP TBA  Chief Complaint:  Uterine mass  Subjective: Pt familiar to IR service from prior consultation with Dr. Vernard Gambles on 09/11/17 and follow up visit on 10/21/17 with Dr. Kathlene Cote to discuss preoperative embolization prior to planned hysterectomy for giant central enlarging mass of the uterus presumed to represent a giant fibroid.  She presents today for the embolization procedure.  Surgery is planned for 11/11/2017.  She currently denies fever, headache, chest pain, dyspnea, cough, abdominal/back pain, nausea, vomiting or bleeding.  She does have abdominal/pelvic distention and some urinary frequency.  Past Medical History:  Diagnosis Date  . Stomach ulcer   . Uterine fibroid    Past Surgical History:  Procedure Laterality Date  . IR RADIOLOGIST EVAL & MGMT  09/11/2017  . IR RADIOLOGIST EVAL & MGMT  10/21/2017      Allergies: Patient has no known allergies.  Medications: Prior to Admission medications   Medication Sig Start Date End Date Taking? Authorizing Provider  ferrous sulfate 325 (65 FE) MG EC tablet Take 325 mg by mouth daily.   Yes [provider]  Multiple Vitamin (MULTIVITAMIN) tablet Take 1 tablet by mouth daily.   Yes [provider]     Vital Signs: Ht 5\' 6"  (1.676 m)   Wt 257 lb 2 oz (116.6 kg)   LMP 10/25/2017 (Exact Date)   BMI 41.50 kg/m   Physical Exam awake, alert.  Chest clear to auscultation bilaterally.  Heart with regular rate and rhythm.  Abdomen obese, soft, positive bowel sounds, palpable enlarged uterus.  Extremities with full range of motion.  Imaging: No results found.  Labs:  CBC: Recent Labs    04/22/17 1645 11/07/17 1248  WBC 4.2 4.0  HGB 14.1 13.4  HCT 43.3 43.0  PLT 148* 146*    COAGS: Recent Labs    11/07/17 1248  INR 0.98    BMP: Recent Labs    04/22/17 1645 11/07/17 1248  NA 140 140  K 3.7  3.7  CL 102 106  CO2 22 25  GLUCOSE 81 88  BUN 10 13  CALCIUM 9.2 8.8*  CREATININE 0.88 0.71  GFRNONAA 82 >60  GFRAA 94 >60    LIVER FUNCTION TESTS: Recent Labs    04/22/17 1645 11/07/17 1248  BILITOT 0.4 0.4  AST 13 17  ALT 9 12  ALKPHOS 76 54  PROT 7.7 7.3  ALBUMIN 4.6 4.0    Assessment and Plan: Patient with history of symptomatic markedly enlarged uterus ,presumably secondary to a single central fibroid measuring 21.5 cm.  She presents today for preoperative bilateral uterine artery embolization prior to planned hysterectomy on 11/11/2017.Risks and benefits of procedure were discussed with the patient/mother including, but not limited to bleeding, infection, vascular injury or contrast induced renal failure.  This interventional procedure involves the use of X-rays and because of the nature of the planned procedure, it is possible that we will have prolonged use of X-ray fluoroscopy.  Potential radiation risks to you include (but are not limited to) the following: - A slightly elevated risk for cancer  several years later in life. This risk is typically less than 0.5% percent. This risk is low in comparison to the normal incidence of human cancer, which is 33% for women and 50% for men according to the [place]. - Radiation induced injury can include skin redness, resembling a rash, tissue breakdown / ulcers and hair  loss (which can be temporary or permanent).   The likelihood of either of these occurring depends on the difficulty of the procedure and whether you are sensitive to radiation due to previous procedures, disease, or genetic conditions.   IF your procedure requires a prolonged use of radiation, you will be notified and given written instructions for further action.  It is your responsibility to monitor the irradiated area for the 2 weeks following the procedure and to notify your physician if you are concerned that you have suffered a radiation  induced injury.    All of the patient's questions were answered, patient is agreeable to proceed.  Consent signed and in chart.      Electronically Signed: D. Rowe Robert, PA-C 11/10/2017, 8:29 AM   I spent a total of 30 minutes at the the patient's bedside AND on the patient's hospital floor or unit, greater than 50% of which was counseling/coordinating care for bilateral uterine artery embolization

## 2017-11-10 NOTE — Progress Notes (Signed)
Patient ID: Vanessa Elliott, female   DOB: [birth year], 42 y.o.   MRN: [id number] Patient currently without significant complaints.  Eating dinner.  Vital signs stable, afebrile Puncture site right common femoral artery soft, clean, dry, nontender, no hematoma, intact distal pulses.  A/P: Status post bilateral uterine artery embolization 10/14 for large symptomatic central uterine mass prior to elective hysterectomy on 10/15; currently stable; further plans as per Dr. Harolyn Rutherford

## 2017-11-10 NOTE — H&P (Signed)
Preoperative History and Physical  Vanessa Elliott is a 42 y.o. G1P1001 here for surgical management of large uterine mass.  She is s/p pre-operative bilateral uterine artery embolization yesterday which she tolerated well. Appreciate the help from Dr. Kathlene Cote and the IR team in performing this procedure.  Patient will undergo ureteral stent placement during her planned surgery for today, appreciate Urology team's expertise and help.  On encounter today, she reports minimal pain after the procedure yesterday. Denies any other concerning symptoms. No other significant preoperative concerns.  Proposed surgery: Total Abdominal Hysterectomy, Bilateral Salpingectomy (with the expertise of and help from Dr. Everitt Amber, GYN Oncology)  Past Medical History:  Diagnosis Date  . Stomach ulcer   . Uterine fibroid    Past Surgical History:  Procedure Laterality Date  . IR ANGIOGRAM PELVIS UTERINE ARTERY EMBOLIZATION BILATERAL 11/10/2017   OB History  Gravida Para Term Preterm AB Living  1 1 1  0 0 1  SAB TAB Ectopic Multiple Live Births  0 0 0 0 1    # Outcome Date GA Lbr Len/2nd Weight Sex Delivery Anes PTL Lv  1 Term      Vag-Spont   LIV  Patient denies any other pertinent gynecologic issues.   Medications: No current facility-administered medications on file prior to encounter.    Current Outpatient Medications on File Prior to Encounter  Medication Sig Dispense Refill  . ferrous sulfate 325 (65 FE) MG EC tablet Take 325 mg by mouth daily.    . Multiple Vitamin (MULTIVITAMIN) tablet Take 1 tablet by mouth daily.     Allergies: No Known Allergies  Social History:   reports that she has never smoked. She has never used smokeless tobacco. She reports that she drank alcohol. She reports that she has current or past drug history.  Family History  Problem Relation Age of Onset  . Diabetes Maternal Grandmother     Review of Systems: Pertinent items noted in HPI and remainder of  comprehensive ROS otherwise negative.  PHYSICAL EXAM: Blood pressure 128/88, pulse 86, temperature 99.3 F (37.4 C), temperature source Oral, resp. rate 16, height 5\' 6"  (1.676 m), weight 116.6 kg, last menstrual period 10/25/2017, SpO2 100 %. CONSTITUTIONAL: Well-developed, well-nourished female in no acute distress.  HENT:  Normocephalic, atraumatic, External right and left ear normal. Oropharynx is clear and moist EYES: Conjunctivae and EOM are normal. Pupils are equal, round, and reactive to light. No scleral icterus.  NECK: Normal range of motion, supple, no masses SKIN: Skin is warm and dry. No rash noted. Not diaphoretic. No erythema. No pallor. NEUROLOGIC: Alert and oriented to person, place, and time. Normal reflexes, muscle tone coordination. No cranial nerve deficit noted. PSYCHIATRIC: Normal mood and affect. Normal behavior. Normal judgment and thought content. CARDIOVASCULAR: Normal heart rate noted, regular rhythm RESPIRATORY: Effort and breath sounds normal, no problems with respiration noted ABDOMEN: Soft, nontender, large uterine mass palpated.  PELVIC: Deferred MUSCULOSKELETAL: Normal range of motion. No edema and no tenderness. 2+ distal pulses.  Labs: Results for orders placed or performed during the hospital encounter of 11/10/17 (from the past 336 hour(s))  Prepare RBC (crossmatch)   Collection Time: 11/10/17 12:55 PM  Result Value Ref Range   Order Confirmation      ORDER PROCESSED BY BLOOD BANK Performed at [facility], [place] [address], [place], [place] [address]   Surgical pcr screen   Collection Time: 11/10/17  7:48 PM  Result Value Ref Range  MRSA, PCR NEGATIVE NEGATIVE   Staphylococcus aureus NEGATIVE NEGATIVE  CBC   Collection Time: 11/11/17  5:02 AM  Result Value Ref Range   WBC 4.6 4.0 - 10.5 K/uL   RBC 4.49 3.87 - 5.11 MIL/uL   Hemoglobin 12.8 12.0 - 15.0 g/dL   HCT 41.7 36.0 - 46.0 %   MCV 92.9 80.0 - 100.0 fL   MCH 28.5  26.0 - 34.0 pg   MCHC 30.7 30.0 - 36.0 g/dL   RDW 14.2 11.5 - 15.5 %   Platelets 120 (L) 150 - 400 K/uL   nRBC 0.0 0.0 - 0.2 %  Comprehensive metabolic panel   Collection Time: 11/11/17  5:02 AM  Result Value Ref Range   Sodium 140 135 - 145 mmol/L   Potassium 3.7 3.5 - 5.1 mmol/L   Chloride 110 98 - 111 mmol/L   CO2 21 (L) 22 - 32 mmol/L   Glucose, Bld 92 70 - 99 mg/dL   BUN 9 6 - 20 mg/dL   Creatinine, Ser 0.72 0.44 - 1.00 mg/dL   Calcium 8.8 (L) 8.9 - 10.3 mg/dL   Total Protein 6.7 6.5 - 8.1 g/dL   Albumin 3.8 3.5 - 5.0 g/dL   AST 15 15 - 41 U/L   ALT 11 0 - 44 U/L   Alkaline Phosphatase 49 38 - 126 U/L   Total Bilirubin 0.7 0.3 - 1.2 mg/dL   GFR calc non Af Amer >60 >60 mL/min   GFR calc Af Amer >60 >60 mL/min   Anion gap 9 5 - 15  Prepare fresh frozen plasma   Collection Time: 11/11/17  8:18 AM  Result Value Ref Range   Unit Number [id number]    Blood Component Type THAWED PLASMA    Unit division 00    Status of Unit ALLOCATED    Transfusion Status OK TO TRANSFUSE    Unit Number [id number]    Blood Component Type THAWED PLASMA    Unit division 00    Status of Unit ALLOCATED    Transfusion Status OK TO TRANSFUSE   BPAM FFP   Collection Time: 11/11/17  8:18 AM  Result Value Ref Range   Blood Product Unit Number [id number]    Unit Type and Rh 9500    Blood Product Expiration Date 016010932355    Blood Product Unit Number [id number]    Unit Type and Rh 5100    Blood Product Expiration Date 376283151761   Results for orders placed or performed during the hospital encounter of 11/07/17 (from the past 336 hour(s))  CBC with Differential/Platelet   Collection Time: 11/07/17 12:48 PM  Result Value Ref Range   WBC 4.0 4.0 - 10.5 K/uL   RBC 4.73 3.87 - 5.11 MIL/uL   Hemoglobin 13.4 12.0 - 15.0 g/dL   HCT 43.0 36.0 - 46.0 %   MCV 90.9 80.0 - 100.0 fL   MCH 28.3 26.0 - 34.0 pg   MCHC 31.2 30.0 - 36.0 g/dL   RDW 13.6 11.5 - 15.5 %   Platelets 146 (L) 150  - 400 K/uL   nRBC 0.0 0.0 - 0.2 %   Neutrophils Relative % 50 %   Neutro Abs 2.0 1.7 - 7.7 K/uL   Lymphocytes Relative 37 %   Lymphs Abs 1.5 0.7 - 4.0 K/uL   Monocytes Relative 11 %   Monocytes Absolute 0.4 0.1 - 1.0 K/uL   Eosinophils Relative 2 %   Eosinophils Absolute 0.1 0.0 - 0.5 K/uL  Basophils Relative 0 %   Basophils Absolute 0.0 0.0 - 0.1 K/uL   Immature Granulocytes 0 %   Abs Immature Granulocytes 0.01 0.00 - 0.07 K/uL  Comprehensive metabolic panel   Collection Time: 11/07/17 12:48 PM  Result Value Ref Range   Sodium 140 135 - 145 mmol/L   Potassium 3.7 3.5 - 5.1 mmol/L   Chloride 106 98 - 111 mmol/L   CO2 25 22 - 32 mmol/L   Glucose, Bld 88 70 - 99 mg/dL   BUN 13 6 - 20 mg/dL   Creatinine, Ser 0.71 0.44 - 1.00 mg/dL   Calcium 8.8 (L) 8.9 - 10.3 mg/dL   Total Protein 7.3 6.5 - 8.1 g/dL   Albumin 4.0 3.5 - 5.0 g/dL   AST 17 15 - 41 U/L   ALT 12 0 - 44 U/L   Alkaline Phosphatase 54 38 - 126 U/L   Total Bilirubin 0.4 0.3 - 1.2 mg/dL   GFR calc non Af Amer >60 >60 mL/min   GFR calc Af Amer >60 >60 mL/min   Anion gap 9 5 - 15  hCG, serum, qualitative (Not at [facility])   Collection Time: 11/07/17 12:48 PM  Result Value Ref Range   Preg, Serum NEGATIVE NEGATIVE  Protime-INR   Collection Time: 11/07/17 12:48 PM  Result Value Ref Range   Prothrombin Time 12.9 11.4 - 15.2 seconds   INR 0.98   Type and screen [place]   Collection Time: 11/07/17 12:50 PM  Result Value Ref Range   ABO/RH(D) O POS    Antibody Screen NEG    Sample Expiration 11/14/2017    Extend sample reason NO TRANSFUSIONS OR PREGNANCY IN THE PAST 3 MONTHS    Unit Number [id number]    Blood Component Type RED CELLS,LR    Unit division 00    Status of Unit ISSUED    Transfusion Status OK TO TRANSFUSE    Crossmatch Result      Compatible Performed at [facility], [place] [address], [place], [place] [address]    Unit Number [id number]    Blood Component  Type RBC LR PHER1    Unit division 00    Status of Unit ISSUED    Transfusion Status OK TO TRANSFUSE    Crossmatch Result Compatible    Unit Number [id number]    Blood Component Type RED CELLS,LR    Unit division 00    Status of Unit ISSUED    Transfusion Status OK TO TRANSFUSE    Crossmatch Result Compatible    Unit Number [id number]    Blood Component Type RED CELLS,LR    Unit division 00    Status of Unit ISSUED    Transfusion Status OK TO TRANSFUSE    Crossmatch Result Compatible   ABO/Rh   Collection Time: 11/07/17 12:50 PM  Result Value Ref Range   ABO/RH(D)      O POS Performed at [facility], [place] [address], [place], [place] [address]   BPAM [facility]   Collection Time: 11/07/17 12:50 PM  Result Value Ref Range   ISSUE DATE / TIME 326712458099    Blood Product Unit Number [id number]    PRODUCT CODE H4193X90    Unit Type and Rh 5100    Blood Product Expiration Date 240973532992    ISSUE DATE / TIME 426834196222    Blood Product Unit Number [id number]    PRODUCT CODE [id number]    Unit Type and Rh 5100  Blood Product Expiration Date 426834196222    ISSUE DATE / TIME 979892119417    Blood Product Unit Number [id number]    PRODUCT CODE S9702O37    Unit Type and Rh 5100    Blood Product Expiration Date 858850277412    ISSUE DATE / TIME 878676720947    Blood Product Unit Number [id number]    PRODUCT CODE L4650P54    Unit Type and Rh 5100    Blood Product Expiration Date 656812751700     Imaging Studies: 05/02/2017  MRI PELVIS WITHOUT AND WITH CONTRAST CLINICAL DATA: Large abdominal and pelvic mass on physical exam.  FINDINGS: Urinary Tract: No urinary bladder or urethral abnormality. Bowel: Unremarkable pelvic bowel loops. Vascular/Lymphatic: Unremarkable. No pathologically enlarged pelvic lymph nodes identified. Reproductive: -- Uterus: Measures 29.7 x 19.2 by 25.2 cm (volume = 7520 cm^3). A single large central uterine fibroid is  seen which measures 21.5 x 16.2 x 20.2 cm (volume = 3680 cm^3). This fibroid shows diffuse heterogeneous contrast enhancement. No other fibroids identified. Cervix is normal in appearance. -- Right ovary: Not visualized, however no adnexal mass identified. -- Left ovary: Not visualized, however no adnexal mass identified. Other: No peritoneal thickening or abnormal free fluid. No evidence of hydronephrosis. Musculoskeletal: Unremarkable. IMPRESSION: Markedly enlarged uterus, with single central fibroid measuring 21.5 cm. Nonvisualization of ovaries, however no adnexal mass identified. Electronically Signed By: Earle Gell M.D. On: 05/02/2017   11/10/2017 Ir Angiogram Pelvis Selective Or Supraselective CLINICAL DATA:  Gigantic central uterine mass measuring approximately 22 cm in height and demonstrating diffuse enhancement by MRI. Prior to elective hysterectomy, uterine artery embolization has been requested to reduce the risk complication and blood loss during hysterectomy given the size of the dominant mass and prominent uterine vascularity present.  Interventional Radiology Procedure Note Procedure: Bilateral internal and uterine arteriography; bilateral uterine artery embolization Complications: None Estimated Blood Loss: < 10 mL Findings: Huge bilateral uterine arteries supplying large trunks to uterus and dominant uterine mass/fibroid. Both uterine arteries embolized with one vial 500-700 micron Embospheres, 3 vials 700-900 micron Embospheres and one sheet of cut Gelfoam pledgets.  Able to significantly slow flow in uterine trunk and branches supplying the dominant fibroid. Plan: Admission for hysterectomy tomorrow.  Will treat any post-embolization pain today/tonight. Venetia Night. Kathlene Cote, M.D  Pager:  [phone]   Assessment: Patient Active Problem List   Diagnosis Date Noted  . Uterine mass 11/10/2017  . Status post embolization of bilateral uterine arteries 11/10/2017     Plan: Appreciate the help of Dr. Kathlene Cote and the Interventional Radiology team in performing the uterine artery embolization on 11/10/17.  Today, patient will undergo surgical management with Total Abdominal Hysterectomy, Bilateral Salpingectomy (TAH, BS) with help from Dr. Everitt Amber, GYN Oncology.  She will undergo intraoperative ureteral stent placement by the Urology team.  The risks of surgery were again discussed in detail with the patient including but not limited to: bleeding which may require transfusion or reoperation; infection which may require antibiotics; injury to surrounding organs which may involve bowel, bladder, ureters; need for additional procedures; thromboembolic phenomenon, surgical site problems and other postoperative/anesthesia complications. Likelihood of success in alleviating the patient's condition was discussed. Routine postoperative instructions will be reviewed with the patient and her family in detail after surgery.  The patient concurred with the proposed plan, giving informed written consent for the surgery.  Patient has been NPO after midnight for procedure.   Preoperative prophylactic antibiotics and SCDs ordered on call to the OR.  She is  also ordered to be crossmatched for 4 units of pRBCs and 4 units of FFP.  To OR when ready.    Verita Schneiders, MD, [place] for [employer], [place]

## 2017-11-11 ENCOUNTER — Other Ambulatory Visit: Payer: Self-pay

## 2017-11-11 ENCOUNTER — Inpatient Hospital Stay (HOSPITAL_COMMUNITY): Payer: BLUE CROSS/BLUE SHIELD | Admitting: Certified Registered Nurse Anesthetist

## 2017-11-11 ENCOUNTER — Encounter (HOSPITAL_COMMUNITY)
Admission: AD | Disposition: A | Discharge status: Home / Self Care | Payer: Self-pay | Source: Home / Self Care | Attending: Interventional Radiology

## 2017-11-11 ENCOUNTER — Inpatient Hospital Stay (HOSPITAL_COMMUNITY)
Admission: RE | Admit: 2017-11-11 | Payer: BLUE CROSS/BLUE SHIELD | Source: Ambulatory Visit | Admitting: Obstetrics & Gynecology

## 2017-11-11 ENCOUNTER — Telehealth: Payer: Self-pay | Admitting: Gynecologic Oncology

## 2017-11-11 ENCOUNTER — Inpatient Hospital Stay (HOSPITAL_COMMUNITY): Payer: BLUE CROSS/BLUE SHIELD

## 2017-11-11 ENCOUNTER — Encounter (HOSPITAL_COMMUNITY): Payer: Self-pay

## 2017-11-11 DIAGNOSIS — D251 Intramural leiomyoma of uterus: Principal | ICD-10-CM

## 2017-11-11 DIAGNOSIS — D259 Leiomyoma of uterus, unspecified: Secondary | ICD-10-CM | POA: Diagnosis present

## 2017-11-11 DIAGNOSIS — D62 Acute posthemorrhagic anemia: Secondary | ICD-10-CM | POA: Diagnosis present

## 2017-11-11 HISTORY — PX: CYSTOSCOPY W/ URETERAL STENT PLACEMENT: SHX1429

## 2017-11-11 HISTORY — PX: HYSTERECTOMY ABDOMINAL WITH SALPINGECTOMY: SHX6725

## 2017-11-11 LAB — POCT I-STAT 7, (LYTES, BLD GAS, ICA,H+H)
Acid-base deficit: 4 mmol/L — ABNORMAL HIGH (ref 0.0–2.0)
BICARBONATE: 22 mmol/L (ref 20.0–28.0)
Calcium, Ion: 1 mmol/L — ABNORMAL LOW (ref 1.15–1.40)
HCT: 38 % (ref 36.0–46.0)
Hemoglobin: 12.9 g/dL (ref 12.0–15.0)
O2 SAT: 100 %
PCO2 ART: 43.5 mmHg (ref 32.0–48.0)
PO2 ART: 191 mmHg — AB (ref 83.0–108.0)
Patient temperature: 36.2
Potassium: 4 mmol/L (ref 3.5–5.1)
Sodium: 140 mmol/L (ref 135–145)
TCO2: 23 mmol/L (ref 22–32)
pH, Arterial: 7.308 — ABNORMAL LOW (ref 7.350–7.450)

## 2017-11-11 LAB — DIC (DISSEMINATED INTRAVASCULAR COAGULATION)PANEL
Fibrinogen: 335 mg/dL (ref 210–475)
INR: 1.18
Prothrombin Time: 14.9 seconds (ref 11.4–15.2)
Smear Review: NONE SEEN
aPTT: 32 seconds (ref 24–36)

## 2017-11-11 LAB — BASIC METABOLIC PANEL
ANION GAP: 8 (ref 5–15)
BUN: 6 mg/dL (ref 6–20)
CALCIUM: 7.8 mg/dL — AB (ref 8.9–10.3)
CO2: 23 mmol/L (ref 22–32)
CREATININE: 0.78 mg/dL (ref 0.44–1.00)
Chloride: 108 mmol/L (ref 98–111)
GFR calc Af Amer: >60 mL/min (ref >60)
GFR calc non Af Amer: >60 mL/min (ref >60)
GLUCOSE: 138 mg/dL — AB (ref 70–99)
Potassium: 3.7 mmol/L (ref 3.5–5.1)
Sodium: 139 mmol/L (ref 135–145)

## 2017-11-11 LAB — COMPREHENSIVE METABOLIC PANEL
ALBUMIN: 3.8 g/dL (ref 3.5–5.0)
ALT: 11 U/L (ref 0–44)
AST: 15 U/L (ref 15–41)
Alkaline Phosphatase: 49 U/L (ref 38–126)
Anion gap: 9 (ref 5–15)
BUN: 9 mg/dL (ref 6–20)
CALCIUM: 8.8 mg/dL — AB (ref 8.9–10.3)
CO2: 21 mmol/L — AB (ref 22–32)
CREATININE: 0.72 mg/dL (ref 0.44–1.00)
Chloride: 110 mmol/L (ref 98–111)
GFR calc non Af Amer: >60 mL/min (ref >60)
Glucose, Bld: 92 mg/dL (ref 70–99)
Potassium: 3.7 mmol/L (ref 3.5–5.1)
SODIUM: 140 mmol/L (ref 135–145)
TOTAL PROTEIN: 6.7 g/dL (ref 6.5–8.1)
Total Bilirubin: 0.7 mg/dL (ref 0.3–1.2)

## 2017-11-11 LAB — CBC
HCT: 41.7 % (ref 36.0–46.0)
HCT: 39.1 % (ref 36.0–46.0)
Hemoglobin: 12.8 g/dL (ref 12.0–15.0)
Hemoglobin: 12.7 g/dL (ref 12.0–15.0)
MCH: 28.5 pg (ref 26.0–34.0)
MCH: 28.9 pg (ref 26.0–34.0)
MCHC: 30.7 g/dL (ref 30.0–36.0)
MCHC: 32.5 g/dL (ref 30.0–36.0)
MCV: 92.9 fL (ref 80.0–100.0)
MCV: 88.9 fL (ref 80.0–100.0)
NRBC: 0 % (ref 0.0–0.2)
PLATELETS: 120 10*3/uL — AB (ref 150–400)
PLATELETS: 103 10*3/uL — AB (ref 150–400)
RBC: 4.49 MIL/uL (ref 3.87–5.11)
RBC: 4.4 MIL/uL (ref 3.87–5.11)
RDW: 14.2 % (ref 11.5–15.5)
RDW: 14.7 % (ref 11.5–15.5)
WBC: 4.6 10*3/uL (ref 4.0–10.5)
WBC: 13.8 10*3/uL — ABNORMAL HIGH (ref 4.0–10.5)
nRBC: 0 % (ref 0.0–0.2)

## 2017-11-11 LAB — DIC (DISSEMINATED INTRAVASCULAR COAGULATION) PANEL
D DIMER QUANT: 2.85 ug{FEU}/mL — AB (ref 0.00–0.50)
PLATELETS: 94 10*3/uL — AB (ref 150–400)

## 2017-11-11 SURGERY — HYSTERECTOMY, TOTAL, ABDOMINAL, WITH SALPINGECTOMY
Anesthesia: General | Laterality: Bilateral

## 2017-11-11 MED ORDER — OXYCODONE HCL 5 MG PO TABS: 5.0000 mg | ORAL_TABLET | ORAL | Status: DC | PRN

## 2017-11-11 MED ORDER — DIPHENHYDRAMINE HCL 50 MG/ML IJ SOLN: 12.5000 mg | Freq: Four times a day (QID) | INTRAMUSCULAR | Status: DC | PRN

## 2017-11-11 MED ORDER — ROCURONIUM BROMIDE 10 MG/ML (PF) SYRINGE
PREFILLED_SYRINGE | INTRAVENOUS | Status: AC
  Filled 2017-11-11: qty 10

## 2017-11-11 MED ORDER — ENOXAPARIN SODIUM 40 MG/0.4ML ~~LOC~~ SOLN
40.0000 mg | SUBCUTANEOUS | Status: DC
  Administered 2017-11-12 – 2017-11-13 (×2): 40 mg via SUBCUTANEOUS
  Filled 2017-11-11 (×2): qty 0.4

## 2017-11-11 MED ORDER — PHENYLEPHRINE 40 MCG/ML (10ML) SYRINGE FOR IV PUSH (FOR BLOOD PRESSURE SUPPORT)
PREFILLED_SYRINGE | INTRAVENOUS | Status: DC | PRN
  Administered 2017-11-11 (×7): 80 ug via INTRAVENOUS

## 2017-11-11 MED ORDER — PHENYLEPHRINE 40 MCG/ML (10ML) SYRINGE FOR IV PUSH (FOR BLOOD PRESSURE SUPPORT)
PREFILLED_SYRINGE | INTRAVENOUS | Status: AC
  Filled 2017-11-11: qty 10

## 2017-11-11 MED ORDER — 0.9 % SODIUM CHLORIDE (POUR BTL) OPTIME
TOPICAL | Status: DC | PRN
  Administered 2017-11-11: 1000 mL

## 2017-11-11 MED ORDER — BUPIVACAINE LIPOSOME 1.3 % IJ SUSP
20.0000 mL | Freq: Once | INTRAMUSCULAR | Status: AC
  Administered 2017-11-11: 20 mL
  Filled 2017-11-11: qty 20

## 2017-11-11 MED ORDER — SODIUM CHLORIDE 0.9 % IV SOLN
INTRAVENOUS | Status: DC | PRN
  Administered 2017-11-11 (×2): via INTRAVENOUS

## 2017-11-11 MED ORDER — SCOPOLAMINE 1 MG/3DAYS TD PT72
MEDICATED_PATCH | TRANSDERMAL | Status: DC | PRN
  Administered 2017-11-11: 1 via TRANSDERMAL

## 2017-11-11 MED ORDER — ALBUMIN HUMAN 5 % IV SOLN
INTRAVENOUS | Status: AC
  Filled 2017-11-11: qty 500

## 2017-11-11 MED ORDER — SODIUM CHLORIDE 0.9 % IV SOLN
INTRAVENOUS | Status: DC
  Filled 2017-11-11: qty 30

## 2017-11-11 MED ORDER — MIDAZOLAM HCL 5 MG/5ML IJ SOLN
INTRAMUSCULAR | Status: DC | PRN
  Administered 2017-11-11: 2 mg via INTRAVENOUS

## 2017-11-11 MED ORDER — LIDOCAINE 2% (20 MG/ML) 5 ML SYRINGE
INTRAMUSCULAR | Status: DC | PRN
  Administered 2017-11-11: 60 mg via INTRAVENOUS

## 2017-11-11 MED ORDER — ZOLPIDEM TARTRATE 5 MG PO TABS: 5.0000 mg | ORAL_TABLET | Freq: Every evening | ORAL | Status: DC | PRN

## 2017-11-11 MED ORDER — HYDROMORPHONE HCL 1 MG/ML IJ SOLN
INTRAMUSCULAR | Status: AC
  Filled 2017-11-11: qty 2

## 2017-11-11 MED ORDER — DEXAMETHASONE SODIUM PHOSPHATE 10 MG/ML IJ SOLN
INTRAMUSCULAR | Status: AC
  Filled 2017-11-11: qty 1

## 2017-11-11 MED ORDER — ONDANSETRON HCL 4 MG/2ML IJ SOLN: 4.0000 mg | Freq: Four times a day (QID) | INTRAMUSCULAR | Status: DC | PRN

## 2017-11-11 MED ORDER — HYDROMORPHONE 1 MG/ML IV SOLN
INTRAVENOUS | Status: DC
  Administered 2017-11-11: 17:00:00 via INTRAVENOUS
  Administered 2017-11-12: 0.6 mg via INTRAVENOUS
  Filled 2017-11-11: qty 25

## 2017-11-11 MED ORDER — ALBUMIN HUMAN 5 % IV SOLN
INTRAVENOUS | Status: DC | PRN
  Administered 2017-11-11 (×2): via INTRAVENOUS

## 2017-11-11 MED ORDER — MIDAZOLAM HCL 2 MG/2ML IJ SOLN
INTRAMUSCULAR | Status: AC
  Filled 2017-11-11: qty 2

## 2017-11-11 MED ORDER — BUPIVACAINE HCL (PF) 0.25 % IJ SOLN
INTRAMUSCULAR | Status: DC | PRN
  Administered 2017-11-11: 20 mL

## 2017-11-11 MED ORDER — CELECOXIB 200 MG PO CAPS: 200.0000 mg | ORAL_CAPSULE | Freq: Two times a day (BID) | ORAL | Status: DC

## 2017-11-11 MED ORDER — ROCURONIUM BROMIDE 10 MG/ML (PF) SYRINGE
PREFILLED_SYRINGE | INTRAVENOUS | Status: DC | PRN
  Administered 2017-11-11: 20 mg via INTRAVENOUS
  Administered 2017-11-11: 30 mg via INTRAVENOUS
  Administered 2017-11-11: 60 mg via INTRAVENOUS
  Administered 2017-11-11 (×2): 20 mg via INTRAVENOUS

## 2017-11-11 MED ORDER — DEXAMETHASONE SODIUM PHOSPHATE 4 MG/ML IJ SOLN
INTRAMUSCULAR | Status: DC | PRN
  Administered 2017-11-11: 10 mg via INTRAVENOUS

## 2017-11-11 MED ORDER — SODIUM CHLORIDE 0.9% FLUSH: 9.0000 mL | INTRAVENOUS | Status: DC | PRN

## 2017-11-11 MED ORDER — BUPIVACAINE HCL (PF) 0.25 % IJ SOLN
INTRAMUSCULAR | Status: AC
  Filled 2017-11-11: qty 30

## 2017-11-11 MED ORDER — PHENYLEPHRINE HCL 10 MG/ML IJ SOLN
INTRAMUSCULAR | Status: AC
  Filled 2017-11-11: qty 2

## 2017-11-11 MED ORDER — SODIUM CHLORIDE 0.9 % IV SOLN
INTRAVENOUS | Status: AC
  Filled 2017-11-11: qty 2

## 2017-11-11 MED ORDER — FENTANYL CITRATE (PF) 250 MCG/5ML IJ SOLN
INTRAMUSCULAR | Status: AC
  Filled 2017-11-11: qty 5

## 2017-11-11 MED ORDER — FENTANYL CITRATE (PF) 100 MCG/2ML IJ SOLN
INTRAMUSCULAR | Status: AC
  Filled 2017-11-11: qty 2

## 2017-11-11 MED ORDER — IOHEXOL 300 MG/ML  SOLN
INTRAMUSCULAR | Status: DC | PRN
  Administered 2017-11-11: 10 mL

## 2017-11-11 MED ORDER — HYDROMORPHONE HCL 1 MG/ML IJ SOLN
0.2000 mg | INTRAMUSCULAR | Status: DC | PRN
  Administered 2017-11-11: 0.2 mg via INTRAVENOUS
  Filled 2017-11-11: qty 1

## 2017-11-11 MED ORDER — SCOPOLAMINE 1 MG/3DAYS TD PT72
MEDICATED_PATCH | TRANSDERMAL | Status: AC
  Filled 2017-11-11: qty 1

## 2017-11-11 MED ORDER — HYDROMORPHONE HCL 1 MG/ML IJ SOLN
0.2500 mg | INTRAMUSCULAR | Status: DC | PRN
  Administered 2017-11-11 (×4): 0.5 mg via INTRAVENOUS

## 2017-11-11 MED ORDER — KCL IN DEXTROSE-NACL 20-5-0.45 MEQ/L-%-% IV SOLN
INTRAVENOUS | Status: DC
  Administered 2017-11-11: 15:00:00 via INTRAVENOUS
  Filled 2017-11-11 (×3): qty 1000

## 2017-11-11 MED ORDER — LACTATED RINGERS IV SOLN
INTRAVENOUS | Status: DC
  Administered 2017-11-11 (×2): via INTRAVENOUS

## 2017-11-11 MED ORDER — FENTANYL CITRATE (PF) 100 MCG/2ML IJ SOLN
INTRAMUSCULAR | Status: DC | PRN
  Administered 2017-11-11 (×2): 50 ug via INTRAVENOUS
  Administered 2017-11-11 (×2): 100 ug via INTRAVENOUS
  Administered 2017-11-11 (×2): 50 ug via INTRAVENOUS

## 2017-11-11 MED ORDER — SUGAMMADEX SODIUM 200 MG/2ML IV SOLN
INTRAVENOUS | Status: AC
  Filled 2017-11-11: qty 4

## 2017-11-11 MED ORDER — ENSURE SURGERY PO LIQD
237.0000 mL | Freq: Two times a day (BID) | ORAL | Status: DC
  Administered 2017-11-12 (×2): 237 mL via ORAL
  Filled 2017-11-11 (×7): qty 237

## 2017-11-11 MED ORDER — NALOXONE HCL 0.4 MG/ML IJ SOLN: 0.4000 mg | INTRAMUSCULAR | Status: DC | PRN

## 2017-11-11 MED ORDER — SUGAMMADEX SODIUM 200 MG/2ML IV SOLN
INTRAVENOUS | Status: DC | PRN
  Administered 2017-11-11: 250 mg via INTRAVENOUS

## 2017-11-11 MED ORDER — GABAPENTIN 300 MG PO CAPS
300.0000 mg | ORAL_CAPSULE | Freq: Two times a day (BID) | ORAL | Status: DC
  Administered 2017-11-11 – 2017-11-13 (×5): 300 mg via ORAL
  Filled 2017-11-11 (×5): qty 1

## 2017-11-11 MED ORDER — DIPHENHYDRAMINE HCL 12.5 MG/5ML PO ELIX: 12.5000 mg | ORAL_SOLUTION | Freq: Four times a day (QID) | ORAL | Status: DC | PRN

## 2017-11-11 MED ORDER — ACETAMINOPHEN 500 MG PO TABS
1000.0000 mg | ORAL_TABLET | Freq: Four times a day (QID) | ORAL | Status: DC
  Administered 2017-11-11 – 2017-11-14 (×10): 1000 mg via ORAL
  Filled 2017-11-11 (×10): qty 2

## 2017-11-11 MED ORDER — ONDANSETRON HCL 4 MG PO TABS: 4.0000 mg | ORAL_TABLET | Freq: Four times a day (QID) | ORAL | Status: DC | PRN

## 2017-11-11 MED ORDER — PROPOFOL 10 MG/ML IV BOLUS
INTRAVENOUS | Status: DC | PRN
  Administered 2017-11-11: 200 mg via INTRAVENOUS

## 2017-11-11 MED ORDER — ONDANSETRON HCL 4 MG/2ML IJ SOLN: 4.0000 mg | Freq: Once | INTRAMUSCULAR | Status: DC | PRN

## 2017-11-11 MED ORDER — SODIUM CHLORIDE 0.9 % IJ SOLN
INTRAMUSCULAR | Status: DC | PRN
  Administered 2017-11-11: 20 mL

## 2017-11-11 MED ORDER — LIDOCAINE 2% (20 MG/ML) 5 ML SYRINGE
INTRAMUSCULAR | Status: AC
  Filled 2017-11-11: qty 5

## 2017-11-11 MED ORDER — SODIUM CHLORIDE 0.9 % IV SOLN
INTRAVENOUS | Status: DC | PRN
  Administered 2017-11-11: 40 ug/min via INTRAVENOUS

## 2017-11-11 MED ORDER — KETOROLAC TROMETHAMINE 30 MG/ML IJ SOLN
30.0000 mg | Freq: Four times a day (QID) | INTRAMUSCULAR | Status: DC
  Administered 2017-11-11 – 2017-11-13 (×8): 30 mg via INTRAVENOUS
  Filled 2017-11-11 (×8): qty 1

## 2017-11-11 MED ORDER — STERILE WATER FOR IRRIGATION IR SOLN
Status: DC | PRN
  Administered 2017-11-11: 1000 mL

## 2017-11-11 MED ORDER — SODIUM CHLORIDE 0.9 % IJ SOLN
INTRAMUSCULAR | Status: AC
  Filled 2017-11-11: qty 20

## 2017-11-11 MED ORDER — PROPOFOL 10 MG/ML IV BOLUS
INTRAVENOUS | Status: AC
  Filled 2017-11-11: qty 20

## 2017-11-11 MED ORDER — ONDANSETRON HCL 4 MG/2ML IJ SOLN
INTRAMUSCULAR | Status: AC
  Filled 2017-11-11: qty 2

## 2017-11-11 SURGICAL SUPPLY — 83 items
ADAPTER GOLDBERG URETERAL (ADAPTER) ×6 IMPLANT
BAG URO CATCHER STRL LF (MISCELLANEOUS) ×3 IMPLANT
BENZOIN TINCTURE PRP APPL 2/3 (GAUZE/BANDAGES/DRESSINGS) IMPLANT
CANISTER SUCT 3000ML PPV (MISCELLANEOUS) IMPLANT
CATH INTERMIT  6FR 70CM (CATHETERS) ×6 IMPLANT
CATH URET 5FR 28IN OPEN ENDED (CATHETERS) ×3 IMPLANT
CELLS DAT CNTRL 66122 CELL SVR (MISCELLANEOUS) IMPLANT
CLIP VESOCCLUDE LG 6/CT (CLIP) ×9 IMPLANT
CLOSURE WOUND 1/2 X4 (GAUZE/BANDAGES/DRESSINGS)
CLOTH BEACON ORANGE TIMEOUT ST (SAFETY) ×3 IMPLANT
CONT PATH 16OZ SNAP LID 3702 (MISCELLANEOUS) IMPLANT
COVER WAND RF STERILE (DRAPES) ×3 IMPLANT
DECANTER SPIKE VIAL GLASS SM (MISCELLANEOUS) ×3 IMPLANT
DERMABOND ADVANCED (GAUZE/BANDAGES/DRESSINGS) ×2
DERMABOND ADVANCED .7 DNX12 (GAUZE/BANDAGES/DRESSINGS) ×1 IMPLANT
DRAPE WARM FLUID 44X44 (DRAPE) ×3 IMPLANT
DRSG OPSITE POSTOP 4X10 (GAUZE/BANDAGES/DRESSINGS) IMPLANT
DRSG OPSITE POSTOP 4X12 (GAUZE/BANDAGES/DRESSINGS) ×3 IMPLANT
DURAPREP 26ML APPLICATOR (WOUND CARE) IMPLANT
ELECT PENCIL ROCKER SW 15FT (MISCELLANEOUS) ×3 IMPLANT
EXTRACTOR STONE NITINOL NGAGE (UROLOGICAL SUPPLIES) IMPLANT
FIBER LASER TRAC TIP (UROLOGICAL SUPPLIES) IMPLANT
GAUZE 4X4 16PLY RFD (DISPOSABLE) IMPLANT
GAUZE SPONGE 4X4 12PLY STRL (GAUZE/BANDAGES/DRESSINGS) ×3 IMPLANT
GAUZE SPONGE 4X4 12PLY STRL LF (GAUZE/BANDAGES/DRESSINGS) IMPLANT
GLOVE BIO SURGEON STRL SZ8 (GLOVE) ×3 IMPLANT
GLOVE BIOGEL PI IND STRL 7.0 (GLOVE) ×3 IMPLANT
GLOVE BIOGEL PI INDICATOR 7.0 (GLOVE) ×6
GLOVE ECLIPSE 7.0 STRL STRAW (GLOVE) ×3 IMPLANT
GOWN STRL REUS W/TWL LRG LVL3 (GOWN DISPOSABLE) ×6 IMPLANT
GOWN STRL REUS W/TWL XL LVL3 (GOWN DISPOSABLE) ×3 IMPLANT
GUIDEWIRE ANG ZIPWIRE 038X150 (WIRE) IMPLANT
GUIDEWIRE STR DUAL SENSOR (WIRE) ×3 IMPLANT
IV NS 1000ML (IV SOLUTION) ×2
IV NS 1000ML BAXH (IV SOLUTION) ×1 IMPLANT
LIGASURE IMPACT 36 18CM CVD LR (INSTRUMENTS) ×3 IMPLANT
MANIFOLD NEPTUNE II (INSTRUMENTS) ×3 IMPLANT
NEEDLE HYPO 22GX1.5 SAFETY (NEEDLE) ×6 IMPLANT
NS IRRIG 1000ML POUR BTL (IV SOLUTION) ×6 IMPLANT
PACK ABDOMINAL GYN (CUSTOM PROCEDURE TRAY) ×3 IMPLANT
PACK CYSTO (CUSTOM PROCEDURE TRAY) ×3 IMPLANT
PAD OB MATERNITY 4.3X12.25 (PERSONAL CARE ITEMS) ×3 IMPLANT
PENCIL SMOKE EVAC W/HOLSTER (ELECTROSURGICAL) IMPLANT
PROTECTOR NERVE ULNAR (MISCELLANEOUS) IMPLANT
RTRCTR WOUND ALEXIS 18CM MED (MISCELLANEOUS)
SHEATH URETERAL 12FRX35CM (MISCELLANEOUS) IMPLANT
SHEET LAVH (DRAPES) ×3 IMPLANT
SPONGE LAP 18X18 RF (DISPOSABLE) ×9 IMPLANT
STAPLER VISISTAT 35W (STAPLE) IMPLANT
STENT CONTOUR 6FRX26X.038 (STENTS) IMPLANT
STENT URET 6FRX24 CONTOUR (STENTS) ×3 IMPLANT
STOPCOCK 4 WAY LG BORE MALE ST (IV SETS) ×6 IMPLANT
STRIP CLOSURE SKIN 1/2X4 (GAUZE/BANDAGES/DRESSINGS) IMPLANT
SURGIFLO W/THROMBIN 8M KIT (HEMOSTASIS) ×3 IMPLANT
SUT MNCRL AB 4-0 PS2 18 (SUTURE) ×6 IMPLANT
SUT PDS AB 0 CTX 60 (SUTURE) IMPLANT
SUT PDS AB 1 TP1 96 (SUTURE) ×6 IMPLANT
SUT PLAIN 2 0 (SUTURE)
SUT PLAIN ABS 2-0 CT1 27XMFL (SUTURE) IMPLANT
SUT SILK 2 0 SH (SUTURE) ×3 IMPLANT
SUT VIC AB 0 CT1 18XCR BRD8 (SUTURE) IMPLANT
SUT VIC AB 0 CT1 27 (SUTURE)
SUT VIC AB 0 CT1 27XBRD ANBCTR (SUTURE) IMPLANT
SUT VIC AB 0 CT1 36 (SUTURE) ×18 IMPLANT
SUT VIC AB 0 CT1 8-18 (SUTURE)
SUT VIC AB 0 CTX 36 (SUTURE)
SUT VIC AB 0 CTX36XBRD ANBCTRL (SUTURE) IMPLANT
SUT VIC AB 2-0 CT1 27 (SUTURE) ×2
SUT VIC AB 2-0 CT1 36 (SUTURE) ×12 IMPLANT
SUT VIC AB 2-0 CT1 TAPERPNT 27 (SUTURE) ×1 IMPLANT
SUT VIC AB 2-0 CT2 27 (SUTURE) ×42 IMPLANT
SUT VIC AB 3-0 SH 27 (SUTURE) ×6
SUT VIC AB 3-0 SH 27XBRD (SUTURE) ×3 IMPLANT
SUT VIC AB 4-0 KS 27 (SUTURE) IMPLANT
SUT VICRYL 0 TIES 12 18 (SUTURE) IMPLANT
SYR CONTROL 10ML LL (SYRINGE) IMPLANT
TAPE CLOTH SURG 4X10 WHT LF (GAUZE/BANDAGES/DRESSINGS) ×3 IMPLANT
TOWEL OR 17X26 10 PK STRL BLUE (TOWEL DISPOSABLE) ×3 IMPLANT
TRAY FOLEY MTR SLVR 14FR STAT (SET/KITS/TRAYS/PACK) IMPLANT
TUBE FEEDING 8FR 16IN STR KANG (MISCELLANEOUS) IMPLANT
TUBING CONNECTING 10 (TUBING) ×2 IMPLANT
TUBING CONNECTING 10' (TUBING) ×1
YANKAUER SUCT BULB TIP 10FT TU (MISCELLANEOUS) ×3 IMPLANT

## 2017-11-11 NOTE — Anesthesia Procedure Notes (Signed)
Procedure Name: Intubation Date/Time: 11/11/2017 9:42 AM Performed by: Claudia Desanctis, CRNA Pre-anesthesia Checklist: Patient identified, Emergency Drugs available, Suction available and Patient being monitored Patient Re-evaluated:Patient Re-evaluated prior to induction Oxygen Delivery Method: Circle system utilized Preoxygenation: Pre-oxygenation with 100% oxygen Induction Type: IV induction Ventilation: Mask ventilation without difficulty Laryngoscope Size: 2 and Miller Grade View: Grade I Tube type: Oral Tube size: 7.0 mm Number of attempts: 1 Airway Equipment and Method: Stylet Placement Confirmation: ETT inserted through vocal cords under direct vision,  positive ETCO2 and breath sounds checked- equal and bilateral Secured at: 21 cm Tube secured with: Tape Dental Injury: Teeth and Oropharynx as per pre-operative assessment

## 2017-11-11 NOTE — Transfer of Care (Signed)
Immediate Anesthesia Transfer of Care Note  Patient: Vanessa Elliott  Procedure(s) Performed: HYSTERECTOMY ABDOMINAL WITH SALPINGECTOMY for uterus greater than 250 grams (Bilateral ) CYSTOSCOPY WITH RETROGRADE PYELOGRAM/URETERAL STENT PLACEMENT (Bilateral )  Patient Location: PACU  Anesthesia Type:General  Level of Consciousness: awake, alert , oriented and patient cooperative  Airway & Oxygen Therapy: Patient Spontanous Breathing and Patient connected to face mask  Post-op Assessment: Report given to RN and Post -op Vital signs reviewed and stable  Post vital signs: Reviewed and stable  Last Vitals:  Vitals Value Taken Time  BP 115/90 11/11/2017  1:32 PM  Temp    Pulse 88 11/11/2017  1:39 PM  Resp 12 11/11/2017  1:39 PM  SpO2 100 % 11/11/2017  1:39 PM  Vitals shown include unvalidated device data.  Last Pain:  Vitals:   11/11/17 0847  TempSrc: Oral  PainSc: 0-No pain         Complications: No apparent anesthesia complications

## 2017-11-11 NOTE — Anesthesia Postprocedure Evaluation (Signed)
Anesthesia Post Note  Patient: Vanessa Elliott  Procedure(s) Performed: HYSTERECTOMY ABDOMINAL WITH SALPINGECTOMY for uterus greater than 250 grams (Bilateral ) CYSTOSCOPY WITH RETROGRADE PYELOGRAM/URETERAL STENT PLACEMENT (Bilateral )     Patient location during evaluation: PACU Anesthesia Type: General Level of consciousness: awake and alert Pain management: pain level controlled Vital Signs Assessment: post-procedure vital signs reviewed and stable Respiratory status: spontaneous breathing, nonlabored ventilation, respiratory function stable and patient connected to nasal cannula oxygen Cardiovascular status: blood pressure returned to baseline and stable Postop Assessment: no apparent nausea or vomiting Anesthetic complications: no    Last Vitals:  Vitals:   11/11/17 1445 11/11/17 1515  BP: 132/75   Pulse: 77   Resp: 10 (!) 9  Temp:    SpO2: 100% 99%    Last Pain:  Vitals:   11/11/17 1445  TempSrc:   PainSc: Tyler Deis

## 2017-11-11 NOTE — Anesthesia Procedure Notes (Signed)
Arterial Line Insertion Start/End10/15/2019 9:55 AM, 11/11/2017 10:02 AM Performed by: Suzette Battiest, MD, anesthesiologist  Patient location: Pre-op. Preanesthetic checklist: patient identified, IV checked, site marked, risks and benefits discussed, surgical consent, monitors and equipment checked, pre-op evaluation, timeout performed and anesthesia consent Lidocaine 1% used for infiltration Left, radial was placed Catheter size: 20 Fr Hand hygiene performed  and maximum sterile barriers used   Attempts: 3 Procedure performed using ultrasound guided technique. Ultrasound Notes:anatomy identified, needle tip was noted to be adjacent to the nerve/plexus identified, no ultrasound evidence of intravascular and/or intraneural injection and image(s) printed for medical record Following insertion, dressing applied and Biopatch. Post procedure assessment: normal and unchanged  Patient tolerated the procedure well with no immediate complications.

## 2017-11-11 NOTE — Op Note (Signed)
OPERATIVE NOTE  PATIENT: Vanessa Elliott DATE OF BIRTH: 06/20/1975 ENCOUNTER DATE: 11/11/17   Preop Diagnosis: intramural fibroid uterus (estimated weight, 2kg)  Postoperative Diagnosis: same  Surgery: Total abdominal hysterectomy >250gm, bilateral salpingectomy (cystoscopy with bilateral retrograde stent placement by Dr Alyson Ingles prior to procedure)  Surgeon: Verita Schneiders, MD Co-Surgeon: Everitt Amber, MD (2 surgeons were necessary for tissue manipulation, retraction and positioning due to the extreme complexity of the case of a 2000gm uterus with anticipated major hemorrhage).   Anesthesia: General   Anesthesiologist: Suzette Battiest MD  Estimated blood loss: 2936ml  IVF: 2000 ml  PRBC: 4 units FFP: 2 units Albumin: 500cc  Urine output: 277 ml   Complications: None   Pathology: Uterus, cervix, bilateral tubes.  Operative findings: 30cm uterus with single intramural fibroid. Grossly normal ovaries and tubes bilaterally. Extreme blood loss from back bleeding from the uterus.  Duplicated ureters.  Procedure: The patient was admitted preoperatively on the day prior to surgery and underwent uterine artery embolization.  She was identified in the preoperative holding area. Informed consent was signed on the chart. Patient was seen history was reviewed and exam was performed.   The patient was then taken to the operating room and placed in the supine position with SCD hose on. General anesthesia was then induced without difficulty. She was then placed in the dorsolithotomy position.  A cystoscopy with bilateral ureteral stent placement procedure was performed by Dr Alyson Ingles (please refer to his separate operative note). Dr Alyson Ingles placed an indwelling catheter.  The abdomen was prepped with chlor prep sponges per protocol. Perineum was prepped with Betadine. The vagina was prepped with Betadine.  IV access included 2 wide bore peripheral IV's and a radial arterial line.   The cell saver machine was present in the room with the perfusionist.   The patient was then draped after the prep was dried. Timeout was performed the patient, procedure, antibiotic, allergy, and length of procedure. A vertical right paramedian incision was and carried down to the underlying fascia using Bovie cautery. The fascia was scored in the fascial incision was extended superiorly and inferiorly using Bovie cautery. The rectus bellies were dissected off the overlying fascia. The peritoneum was tented and entered. The peritoneal incision was extended superiorly and inferiorly with visualization of the underlying peritoneal cavity. The Buchwalter self-retaining retractor was then placed. At the initial placement as well as at several points during the case the lateral blades were checked to ensure no significant pressure on the psoas bellies.  The small and large bowel were packed out of the way of the surgical field with moist laparotomy sponges and malleable retractors were attached to the [place].  The round ligament on the patient's right side was transected with ligasure cautery as it was so apparently vascular.  The anterior posterior leaves the broad ligament were opened. A window was made between the utero-ovarian vessels and the right ureters which were palpable with stents deep in the retroperitoneum. They were not able to be visualized at this time due to the massive size of the uterus preventing retroperitoneal visualization. The utero-ovarian vessels were clamped with the ligasure and sealed and transected. Back bleeding occurred on the uterine side which was made somewhat hemostatic with clips. The bladder flap was created at this point using careful skeletonization of the bladder from the lower uterine segment with the bovie. An avascular plane was entered. This somewhat skeletonized the right uterine vascular pedicle. The uterine vessels on the right side were  skeletonized. These  were massive measuring >1cm diameter. The uterine vessels were clamped with curved Haeney clamps transected and suture ligated. Additional hemostasis of these vessels was created with clips. Our attention was turned to the left side were similar procedure was performed in that the round ligament was transected and the anterior and posterior leaves the broad ligament were opened. A window was made between the vessels and the palpable ureters on the left and the utero-ovarian vessels were clamped and bipolar sealed and cut with the ligasure. The uterine vessels were skeletonized clamped x2 transected and suture ligated. At this point there became massive bleeding from the retracted vessels on the surface of the bulbous uterine fundus. Approximately 2L of blood was lost within 15 minutes of operating. At this time the cell saver was asked to be set up and used, unfortunately heparinized saline was not able to be made available from the pharmacy in a timely enough fashion to utilize it. Fortunately, 4 units of PRBC were available in the OR and these began being rapidly transfused with an additional 2 units of FFP. The patient briefly required pressors, however stabilized rapidly with blood product resuscitation.  We continued down the cardinal ligaments with straight Haeney clamps followed by clamping and suture ligasure. Hemeclips were used to reinforce hemostasis at the major vascular pedicles. At the cervicovaginal junction we came across the vagina just under the cervix with curved Haeney clamps. The cervix was amputated from the vagina. The angle pedicles as well as vaginal cuff were closed using figure-of-eight sutures of 0 Vicryl.  At this point all bleeding became controlled and the uterine specimen was handed off to pathology.   The fallopian tubes were carefully resected from the ovaries with ligasure.  The pedicles were noted to be hemostatic.   Floseal was applied to the vaginal cuff.   The  abdomen pelvis were copiously irrigated. The retractor and laparotomy sponges were removed. The fascia was closed using running mass closure of #1 PDS. The subcutaneous tissues were irrigated and made hemostatic. 20 mL of Exparel within 71mL of marcaine and 20 mL of normal saline was injected for postoperative pain control. The skin was closed using subcuticular suture.  All instrument, suture, laparotomy, Ray-Tec, and needle counts were correct x2 as was the wand scan for raytechs. The patient tolerated the procedure well and was taken recovery room in stable condition. This is Everitt Amber dictating an operative note on Vanessa Elliott. Thereasa Solo, MD

## 2017-11-11 NOTE — Progress Notes (Addendum)
Faculty Practice OB/GYN Attending Note  Called RN to discuss patient; I am unable to go to [facility] as I am currently covering L&D at [facility].  As per RN report, she is doing well after her complex surgery earlier today. No immediate complications noted. She is tolerating a clear liquid diet and pain is controlled on Toradol and Dilaudid PCA.   No other immediate postoperative concerns.  Patient Vitals for the past 24 hrs:  BP Temp Temp src Pulse Resp SpO2 Height Weight  11/11/17 2017 - 98.1 F (36.7 C) Oral - - - - -  11/11/17 1939 - - - - 11 98 % - -  11/11/17 1900 - - - 72 11 98 % - -  11/11/17 1800 - - - 75 16 99 % - -  11/11/17 1726 - - - - 16 100 % - -  11/11/17 1700 - - - 76 10 100 % - -  11/11/17 1600 - - - 76 11 100 % - -  11/11/17 1530 - - - 75 (!) 9 100 % 5\' 6"  (1.676 m) 115.1 kg  11/11/17 1515 - - - 77 (!) 9 99 % - -  11/11/17 1500 - 98.8 F (37.1 C) Oral - (!) 9 100 % - -  11/11/17 1445 132/75 - - 77 10 100 % - -  11/11/17 1430 130/71 98.7 F (37.1 C) - 79 12 100 % - -  11/11/17 1415 132/73 - - 78 11 100 % - -  11/11/17 1400 133/79 - - 88 13 100 % - -  11/11/17 1345 132/70 - - 82 11 100 % - -  11/11/17 1333 115/90 98.6 F (37 C) - 88 10 100 % - -  11/11/17 0847 128/88 99.3 F (37.4 C) Oral 86 16 100 % 5\' 6"  (1.676 m) 116.6 kg  11/11/17 0441 114/63 98.6 F (37 C) Oral 83 16 100 % - -  11/11/17 0414 - - - - 19 100 % - -  11/11/17 0000 112/69 98.2 F (36.8 C) Oral 78 16 100 % - -  11/10/17 2352 - - - - 20 100 % - -   I/O last 3 completed shifts: In: 6060.1 [P.O.:120; I.V.:3604.1; Blood:1836; IV GBEEFEOFH:219] Out: 7588 [Urine:2350; Blood:2900]  CBC Latest Ref Rng & Units 11/11/2017 11/11/2017 11/11/2017  WBC 4.0 - 10.5 K/uL 13.8(H) - -  Hemoglobin 12.0 - 15.0 g/dL 12.7 12.9 -  Hematocrit 36.0 - 46.0 % 39.1 38.0 -  Platelets 150 - 400 K/uL 103(L) - 94(L)  Preoperative hemoglobin 12.8  A/P: POD#0 Total abdominal hysterectomy and bilateral salpingectomy for a  large fibroid uterus, complicated by large EBL requiring blood products despite preoperative bilateral uterine artery embolization (EBL 2900 ml; received 4 PRBCs, 2 FFP, 500 ml Albumin). Patient also underwent cystoscopy with bilateral retrograde stent placement; noted to have duplicated ureters bilaterally and thus needed placement of four ureteral stents.  - Continue ICU care for now, will likely transfer to floor tomorrow if remains stable. Appreciate the care by the ICU team of this patient. - Stable hemoglobin s/p transfusion for now, will follow up recheck in am.  - Follow up BMET in am - Will start on postoperative Lovenox tomorrow pending stable hemoglobin and creatinine and no other concerning symptoms. Continue SCDs, encourage OOB/ambulation for now for VTE prophylaxis. - Regular diet as tolerated - Urinary foley and ureteral stent care as per Urology instructions, appreciate their help and expertise - Continue routine postoperative care. Appreciate the  help and expertise of Dr. Everitt Amber, GYN Oncology, with this complex case.   Verita Schneiders, MD, [employer] for [employer], [place]

## 2017-11-12 ENCOUNTER — Encounter (HOSPITAL_COMMUNITY): Payer: Self-pay | Admitting: Obstetrics & Gynecology

## 2017-11-12 LAB — CBC
HEMATOCRIT: 33 % — AB (ref 36.0–46.0)
HEMOGLOBIN: 10.7 g/dL — AB (ref 12.0–15.0)
MCH: 29.1 pg (ref 26.0–34.0)
MCHC: 32.4 g/dL (ref 30.0–36.0)
MCV: 89.7 fL (ref 80.0–100.0)
Platelets: 112 10*3/uL — ABNORMAL LOW (ref 150–400)
RBC: 3.68 MIL/uL — ABNORMAL LOW (ref 3.87–5.11)
RDW: 14.9 % (ref 11.5–15.5)
WBC: 12.7 10*3/uL — AB (ref 4.0–10.5)
nRBC: 0 % (ref 0.0–0.2)

## 2017-11-12 LAB — BASIC METABOLIC PANEL
Anion gap: 5 (ref 5–15)
BUN: 5 mg/dL — ABNORMAL LOW (ref 6–20)
CHLORIDE: 113 mmol/L — AB (ref 98–111)
CO2: 23 mmol/L (ref 22–32)
Calcium: 7.9 mg/dL — ABNORMAL LOW (ref 8.9–10.3)
Creatinine, Ser: 0.87 mg/dL (ref 0.44–1.00)
GFR calc non Af Amer: >60 mL/min (ref >60)
GLUCOSE: 113 mg/dL — AB (ref 70–99)
Potassium: 3.6 mmol/L (ref 3.5–5.1)
Sodium: 141 mmol/L (ref 135–145)

## 2017-11-12 MED ORDER — SODIUM CHLORIDE 0.9% FLUSH: 3.0000 mL | INTRAVENOUS | Status: DC | PRN

## 2017-11-12 MED ORDER — SODIUM CHLORIDE 0.9% FLUSH
3.0000 mL | Freq: Two times a day (BID) | INTRAVENOUS | Status: DC
  Administered 2017-11-12 – 2017-11-13 (×3): 3 mL via INTRAVENOUS

## 2017-11-12 MED ORDER — SODIUM CHLORIDE 0.9 % IV SOLN: 250.0000 mL | INTRAVENOUS | Status: DC | PRN

## 2017-11-12 NOTE — Progress Notes (Signed)
1 Days Post-Op  HYSTERECTOMY ABDOMINAL WITH BILATERAL SALPINGECTOMY for uterus > 2000 grams  CYSTOSCOPY WITH RETROGRADE PYELOGRAM AND BILATERAL URETERAL STENT PLACEMENT  2 Days Post-Op BILATERAL UTERINE ARTERY EMBOLIZATION  Subjective: Patient reports incisional pain and tolerating PO.  No flatus yet. Pain controlled on pain meds, barely used PCA. No OOB  Objective: I have reviewed patient's vital signs, intake and output, medications and labs.  Blood pressure (!) 134/55, pulse 72, temperature 98.4 F (36.9 C), temperature source Oral, resp. rate 12, height 5\' 6"  (1.676 m), weight 115 kg, last menstrual period 10/25/2017, SpO2 100 %.  10/15 0701 - 10/16 0700 In: 5935.9 [I.V.:3599.9; WGYKZ:9935; IV TSVXBLTJQ:300] Out: 9233 [AQTMA:2633; Blood:2900]  General: alert and no distress Resp: clear to auscultation bilaterally Cardio: regular rate and rhythm GI: soft, minimal diffuse tenderness; bowel sounds normal; no masses,  no organomegaly and incision: clean, dry, intact and no drainage present Extremities: extremities normal, atraumatic, no cyanosis or edema, Homans sign is negative, no sign of DVT and no edema, redness or tenderness in the calves or thighs Vaginal Bleeding: minimal  CBC Latest Ref Rng & Units 11/12/2017 11/11/2017 11/11/2017  WBC 4.0 - 10.5 K/uL 12.7(H) 13.8(H) -  Hemoglobin 12.0 - 15.0 g/dL 10.7(L) 12.7 12.9  Hematocrit 36.0 - 46.0 % 33.0(L) 39.1 38.0  Platelets 150 - 400 K/uL 112(L) 103(L) -   BMP Latest Ref Rng & Units 11/12/2017 11/11/2017 11/11/2017  Glucose 70 - 99 mg/dL 113(H) 138(H) -  BUN 6 - 20 mg/dL 5(L) 6 -  Creatinine 0.44 - 1.00 mg/dL 0.87 0.78 -  BUN/Creat Ratio 9 - 23 - - -  Sodium 135 - 145 mmol/L 141 139 140  Potassium 3.5 - 5.1 mmol/L 3.6 3.7 4.0  Chloride 98 - 111 mmol/L 113(H) 108 -  CO2 22 - 32 mmol/L 23 23 -  Calcium 8.9 - 10.3 mg/dL 7.9(L) 7.8(L) -    Assessment: 1 Days Post-Op  HYSTERECTOMY ABDOMINAL WITH BILATERAL SALPINGECTOMY for  uterus > 2000 grams  CYSTOSCOPY WITH RETROGRADE PYELOGRAM AND BILATERAL URETERAL STENT PLACEMENT  2 Days Post-Op BILATERAL UTERINE ARTERY EMBOLIZATION Progressing well. Surgery details discussed with patient, all questions answered.  Plan: Transfer to Med-Surg floor today Regular diet as tolerated Advance to PO medication Discontinue IV fluids Encourage ambulation Continue foley due to urinary output monitoring and stents in place; Urology to give recommendations about these  Lovenox to be started today for VTE prophylaxis Follow up daily labs  Routine postoperative care   LOS: 2 days    Verita Schneiders, MD   [phone] (cell) or [phone] [facility] GYN Attending on call)

## 2017-11-12 NOTE — Progress Notes (Signed)
2 Days Post-Op Subjective: Patient reports mild abdominal pain the mil right flank pain. Urine is clear from left externalized ureteral catheter. Urine light pink from both right ureteral catheters.  Objective: Vital signs in last 24 hours: Temp:  [98.1 F (36.7 C)-98.9 F (37.2 C)] 98.9 F (37.2 C) (10/16 1309) Pulse Rate:  [69-88] 76 (10/16 1309) Resp:  [9-18] 18 (10/16 1309) BP: (104-140)/(51-79) 121/62 (10/16 1309) SpO2:  [98 %-100 %] 100 % (10/16 1309) Arterial Line BP: (110-119)/(45-55) 119/55 (10/15 1500) Weight:  [115 kg-115.1 kg] 115 kg (10/16 0156)  Intake/Output from previous day: 10/15 0701 - 10/16 0700 In: 5935.9 [I.V.:3599.9; XQJJH:4174; IV Piggyback:500] Out: 0814 [Urine:2075; Blood:2900] Intake/Output this shift: Total I/O In: 1173.5 [I.V.:1173.5] Out: 1150 [Urine:1150]  Physical Exam:  General:alert, cooperative and appears stated age GI: soft, non tender, normal bowel sounds, no palpable masses, no organomegaly, no inguinal hernia Female genitalia: not done Extremities: extremities normal, atraumatic, no cyanosis or edema  Lab Results: Recent Labs    11/11/17 1252 11/11/17 1747 11/12/17 0402  HGB 12.9 12.7 10.7*  HCT 38.0 39.1 33.0*   BMET Recent Labs    11/11/17 1747 11/12/17 0402  NA 139 141  K 3.7 3.6  CL 108 113*  CO2 23 23  GLUCOSE 138* 113*  BUN 6 5*  CREATININE 0.78 0.87  CALCIUM 7.8* 7.9*   Recent Labs    11/11/17 1243  INR 1.18   No results for input(s): LABURIN in the last 72 hours. Results for orders placed or performed during the hospital encounter of 11/10/17  Surgical pcr screen     Status: None   Collection Time: 11/10/17  7:48 PM  Result Value Ref Range Status   MRSA, PCR NEGATIVE NEGATIVE Final   Staphylococcus aureus NEGATIVE NEGATIVE Final    Comment: (NOTE) The Xpert SA Assay (FDA approved for NASAL specimens in patients 24 years of age and older), is one component of a comprehensive surveillance program. It  is not intended to diagnose infection nor to guide or monitor treatment. Performed at [facility], [place] [address], [place], [place] [address]     Studies/Results: Dg C-arm 1-60 Min-no Report  Result Date: 11/11/2017 Fluoroscopy was utilized by the requesting physician.  No radiographic interpretation.    Assessment/Plan: POD #1 ureteral stent placement and hysterectomy  1. Left externalized ureteral stent removed today. We will keep her right ureteral stent and foley in place until tomorrow. I discussed with the patient the left internalized stent which will remain in place for 4 weeks and be removed n=in the office at [facility]   LOS: 2 days   Nicolette Bang 11/12/2017, 1:42 PM

## 2017-11-12 NOTE — Op Note (Signed)
Preoperative diagnosis: fibroid uterus causing displacement of ureters  Postoperative diagnosis: Same  Procedure: 1 cystoscopy 2. Bilateral retrograde pyelography 3.  Intraoperative fluoroscopy, under one hour, with interpretation 4.  Left upper pole externalized stent placement, left 6x24 JJ ureteral stent placement for lower pole ureter, right externalized stent placement for upper and lower pole ureters  Attending: Rosie Fate  Anesthesia: General  Estimated blood loss: None  Drains: Left upper pole externalized stent, left 6x24 JJ ureteral stent for lower pole ureter, right externalized stent for upper and lower pole ureters. 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 Mild left hydronephrosis in the lower pole collecting system. 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 No hydronephrosis in upper left and both right collecting systems. No masses/lesions in the bladder.   Indications: Patient is a 42 year old female with a history of a fibroid uterus causing displacement of her ureters. She is scheduled to undergo hysterectomy and Urology was consulted for externalized stent placement After discussing treatment options, they decided proceed with bilateral stent placement.  Procedure her in detail: The patient was brought to the operating room and a brief timeout was done to ensure correct patient, correct procedure, correct site.  General anesthesia was administered patient was placed in dorsal lithotomy position.  Her genitalia was then prepped and draped in usual sterile fashion.  A rigid 1 French cystoscope was passed in the urethra and the bladder.  Bladder was inspected free masses or lesions.  We identified duplicated ureters with a 2 ureteral orifices for each kidney. a 5 french ureteral catheter was then instilled into the left distal ureteral orifice.  a gentle retrograde was  obtained and findings noted above. We then advanced a zipwire up to the renal pelvis. We then placed a externalized ureteral stent over the zip wire. a 5 french ureteral catheter was then instilled into the left proximal ureteral orifice.  a gentle retrograde was obtained and findings noted above. We then advanced a zipwire up to the renal pelvis. we then placed a 6 x 24 double-j ureteral stent over the zip wire.  We then removed the wire and good coil was noted in the the renal pelvis under fluoroscopy and the bladder under direct vision. We then placed a externalized ureteral stent over the zip wire.   We then turned out attention to the right side. We cannulated the right distal ureteral orifice with a 6 french ureteral catheter. a gentle retrograde was obtained and findings noted above.We then placed a externalized ureteral stent over the zip wire. We cannulated the right proximal ureteral orifice with a 6 french ureteral catheter. a gentle retrograde was obtained and findings noted above.We then placed a externalized ureteral stent over the zip wire We then advanced a zipwire up to the renal pelvis.  We then removed the scope and placed a 16 french foley. The externalized stents with the tied to the foley with a 0 silk tie.   the bladder was then drained and this concluded the procedure which was well tolerated by patient.  Complications: None  Condition: Stable,  Plan: The patient is the have her left externalized stent removed POD#1 and then her foley and right externalized stents removed POD#2. The l;eft internal ureteral stent will remain in place for 4 weeks.

## 2017-11-12 NOTE — Progress Notes (Signed)
Patient to transfer to [facility] report given to receiving nurse, all questions answered at this time.  Pt. VSS with no s/s of distress noted.  Patient stable for transfer.

## 2017-11-12 NOTE — Care Management Note (Signed)
Case Management Note  Patient Details  Name: Vanessa Elliott MRN: [id number] Date of Birth: 03/24/1975  Subjective/Objective:                  11/11/17-had uterine artery embolization and tah at [facility] on [id number]/ iv dilaudid pca pump, iv d51/2ns at 100cc/hr  Action/Plan: Lives at home with parent Will follow for progression of care and clinical status. Will follow for case management needs none present at this time. Expected Discharge Date:                  Expected Discharge Plan:  Home/Self Care  In-House Referral:     Discharge planning Services  CM Consult  Post Acute Care Choice:    Choice offered to:     DME Arranged:    DME Agency:     HH Arranged:    HH Agency:     Status of Service:  In process, will continue to follow  If discussed at Long Length of Stay Meetings, dates discussed:    Additional Comments:  Leeroy Cha, RN 11/12/2017, 8:57 AM

## 2017-11-12 NOTE — Progress Notes (Signed)
Dr. Gerarda Fraction and I saw the patient this am.  She is progressing as planned.  Continue plan of care per OB/GYN Team.

## 2017-11-13 LAB — CBC
HCT: 32.5 % — ABNORMAL LOW (ref 36.0–46.0)
HEMOGLOBIN: 10.2 g/dL — AB (ref 12.0–15.0)
MCH: 28.6 pg (ref 26.0–34.0)
MCHC: 31.4 g/dL (ref 30.0–36.0)
MCV: 91 fL (ref 80.0–100.0)
Platelets: 143 10*3/uL — ABNORMAL LOW (ref 150–400)
RBC: 3.57 MIL/uL — ABNORMAL LOW (ref 3.87–5.11)
RDW: 15.3 % (ref 11.5–15.5)
WBC: 9 10*3/uL (ref 4.0–10.5)
nRBC: 0 % (ref 0.0–0.2)

## 2017-11-13 LAB — BASIC METABOLIC PANEL
Anion gap: 6 (ref 5–15)
BUN: 10 mg/dL (ref 6–20)
CHLORIDE: 113 mmol/L — AB (ref 98–111)
CO2: 23 mmol/L (ref 22–32)
CREATININE: 0.84 mg/dL (ref 0.44–1.00)
Calcium: 8.2 mg/dL — ABNORMAL LOW (ref 8.9–10.3)
GFR calc Af Amer: >60 mL/min (ref >60)
GFR calc non Af Amer: >60 mL/min (ref >60)
GLUCOSE: 93 mg/dL (ref 70–99)
Potassium: 3.7 mmol/L (ref 3.5–5.1)
SODIUM: 142 mmol/L (ref 135–145)

## 2017-11-13 MED ORDER — DOCUSATE SODIUM 100 MG PO CAPS
100.0000 mg | ORAL_CAPSULE | Freq: Two times a day (BID) | ORAL | Status: DC
  Administered 2017-11-13 (×2): 100 mg via ORAL
  Filled 2017-11-13 (×2): qty 1

## 2017-11-13 MED ORDER — BISACODYL 10 MG RE SUPP: 10.0000 mg | Freq: Every day | RECTAL | Status: DC | PRN

## 2017-11-13 MED ORDER — IBUPROFEN 200 MG PO TABS
600.0000 mg | ORAL_TABLET | Freq: Four times a day (QID) | ORAL | Status: DC | PRN
  Administered 2017-11-13: 600 mg via ORAL
  Filled 2017-11-13: qty 3

## 2017-11-13 NOTE — Progress Notes (Signed)
2 Days Post-Op  HYSTERECTOMY ABDOMINAL WITH BILATERAL SALPINGECTOMY for uterus > 2000 grams  CYSTOSCOPY WITH RETROGRADE PYELOGRAM AND BILATERAL URETERAL STENT PLACEMENT  3 Days Post-Op BILATERAL UTERINE ARTERY EMBOLIZATION  Subjective: Patient reports mild incisional pain and tolerating PO.  + flatus, no BM yet. Pain controlled on pain meds. No OOB  Objective: I have reviewed patient's vital signs, intake and output, medications and labs.  Blood pressure 116/63, pulse 89, temperature 99.7 F (37.6 C), temperature source Oral, resp. rate 18, height 5\' 6"  (1.676 m), weight 115.1 kg, last menstrual period 10/25/2017, SpO2 98 %.  10/16 0701 - 10/17 0700 In: 2853.1 [P.O.:1430; I.V.:1423.1] Out: 2550 [Urine:2550]  General: alert and no distress GI: soft, minimal diffuse tenderness:incision: clean, dry, intact and no drainage present Vaginal Bleeding: minimal  Extremities: extremities normal, atraumatic, no cyanosis or edema, Homans sign is negative, no sign of DVT and no edema, redness or tenderness in the calves or thighs   CBC Latest Ref Rng & Units 11/13/2017 11/12/2017 11/11/2017  WBC 4.0 - 10.5 K/uL 9.0 12.7(H) 13.8(H)  Hemoglobin 12.0 - 15.0 g/dL 10.2(L) 10.7(L) 12.7  Hematocrit 36.0 - 46.0 % 32.5(L) 33.0(L) 39.1  Platelets 150 - 400 K/uL 143(L) 112(L) 103(L)   BMP Latest Ref Rng & Units 11/13/2017 11/12/2017 11/11/2017  Glucose 70 - 99 mg/dL 93 113(H) 138(H)  BUN 6 - 20 mg/dL 10 5(L) 6  Creatinine 0.44 - 1.00 mg/dL 0.84 0.87 0.78  BUN/Creat Ratio 9 - 23 - - -  Sodium 135 - 145 mmol/L 142 141 139  Potassium 3.5 - 5.1 mmol/L 3.7 3.6 3.7  Chloride 98 - 111 mmol/L 113(H) 113(H) 108  CO2 22 - 32 mmol/L 23 23 23   Calcium 8.9 - 10.3 mg/dL 8.2(L) 7.9(L) 7.8(L)    Assessment: 2 Days Post-Op  HYSTERECTOMY ABDOMINAL WITH BILATERAL SALPINGECTOMY for uterus > 2000 grams  CYSTOSCOPY WITH RETROGRADE PYELOGRAM AND BILATERAL URETERAL STENT PLACEMENT  3 Days Post-Op BILATERAL UTERINE  ARTERY EMBOLIZATION Progressing well.   Plan: Continue regular diet as tolerated Continue oral medication Encourage ambulation Lovenox for VTE prophylaxis Routine postoperative care Likely discharge to home tomorrow afternoon if remains stable.   LOS: 3 days    Verita Schneiders, MD   [phone] (cell) or [phone] [facility] GYN Attending on call)

## 2017-11-13 NOTE — Progress Notes (Signed)
Patient alert, oriented, resting comfortably in bed.  Tolerating diet with no nausea or emesis.  Passing flatus.  Stating she is ready to go home.  Asking how long the hematuria will remain.  Progressing as planned.  Labs reviewed. No needs or concerns voiced at end of visit.

## 2017-11-14 ENCOUNTER — Encounter (HOSPITAL_COMMUNITY): Payer: Self-pay

## 2017-11-14 DIAGNOSIS — Q625 Duplication of ureter: Secondary | ICD-10-CM

## 2017-11-14 LAB — PREPARE PLATELET PHERESIS: Unit division: 0

## 2017-11-14 LAB — BPAM PLATELET PHERESIS
Blood Product Expiration Date: 201910162359
ISSUE DATE / TIME: 201910151210
Unit Type and Rh: 6200

## 2017-11-14 MED ORDER — ACETAMINOPHEN 500 MG PO TABS: 1000.0000 mg | ORAL_TABLET | Freq: Four times a day (QID) | ORAL | 0 refills | Status: AC

## 2017-11-14 MED ORDER — GABAPENTIN 300 MG PO CAPS: 300.0000 mg | ORAL_CAPSULE | Freq: Two times a day (BID) | ORAL | 2 refills | Status: AC

## 2017-11-14 MED ORDER — IBUPROFEN 600 MG PO TABS: 600.0000 mg | ORAL_TABLET | Freq: Four times a day (QID) | ORAL | 3 refills | Status: AC | PRN

## 2017-11-14 MED ORDER — OXYCODONE HCL 5 MG PO TABS: 5.0000 mg | ORAL_TABLET | ORAL | 0 refills | Status: AC | PRN

## 2017-11-14 MED ORDER — DOCUSATE SODIUM 100 MG PO CAPS: 100.0000 mg | ORAL_CAPSULE | Freq: Two times a day (BID) | ORAL | 2 refills | Status: AC | PRN

## 2017-11-14 NOTE — Plan of Care (Signed)
Reviewed discharge instructions with patient. No IV in place. Patient ready for discharge.

## 2017-11-14 NOTE — Discharge Summary (Signed)
Gynecology Physician Postoperative Discharge Summary  Patient ID: Vanessa Elliott MRN: [id number] DOB/AGE: 1975/11/04 42 y.o.  Admit Date: 11/10/2017 Discharge Date: 11/14/2017  Preoperative Diagnoses: Symptomatic uterine fibroid  Discharge Diagnoses: Principal Problem:   S/P TAH (total abdominal hysterectomy)  for large fibroid uterus Active Problems:   Status post embolization of bilateral uterine arteries   Acute blood loss anemia   Duplication of bilateral ureters  Procedures:  HYSTERECTOMY ABDOMINAL WITH BILATERAL SALPINGECTOMY CYSTOSCOPY WITH RETROGRADE PYELOGRAM AND BILATERAL URETERAL [facility] Course:  Vanessa Elliott is a 42 y.o. G1P1001  admitted for scheduled surgery.  She underwent the procedures as mentioned above, her operation was complicated by larger than expected blood loss requiring transfusion of 4 units of pRBCs and 2 units of FFP. For further details about surgery, please refer to the operative reports.  Patient was also incidentally noted to have duplication of bilateral ureters during her stenting procedure; please refer to Urology notes for further details.   Patient had an uncomplicated postoperative course. By time of discharge on POD#3, her pain was controlled on oral pain medications; she was ambulating, voiding without difficulty, tolerating regular diet,  passing flatus and having bowel movements. She was deemed stable for discharge to home with outpatient management.   Significant Labs: CBC Latest Ref Rng & Units 11/13/2017 11/12/2017 11/11/2017  WBC 4.0 - 10.5 K/uL 9.0 12.7(H) 13.8(H)  Hemoglobin 12.0 - 15.0 g/dL 10.2(L) 10.7(L) 12.7  Hematocrit 36.0 - 46.0 % 32.5(L) 33.0(L) 39.1  Platelets 150 - 400 K/uL 143(L) 112(L) 103(L)    Discharge Exam: Blood pressure 119/70, pulse 78, temperature 98.6 F (37 C), temperature source Oral, resp. rate 18, height 5\' 6"  (1.676 m), weight 111.4 kg,  last menstrual period 10/25/2017, SpO2 99 %. General appearance: alert and no distress  Resp: clear to auscultation bilaterally  Cardio: regular rate and rhythm  GI: soft, non-tender; bowel sounds normal; no masses, no organomegaly.  Incision: C/D/I, no erythema, old drainage noted on Honeycomb dressing Pelvic: scant blood on pad  Extremities: extremities normal, atraumatic, no cyanosis or edema and Homans sign is negative, no sign of DVT  Discharged Condition: Stable  Discharge disposition: 01-Home or Self Care  Discharge Instructions    Discharge patient   Complete by:  As directed    Discharge disposition:  01-Home or Self Care   Discharge patient date:  11/14/2017     Allergies as of 11/14/2017   No Known Allergies     Medication List    TAKE these medications   acetaminophen 500 MG tablet Commonly known as:  TYLENOL Take 2 tablets (1,000 mg total) by mouth every 6 (six) hours.   docusate sodium 100 MG capsule Commonly known as:  COLACE Take 1 capsule (100 mg total) by mouth 2 (two) times daily as needed for mild constipation or moderate constipation.   ferrous sulfate 325 (65 FE) MG EC tablet Take 325 mg by mouth daily.   gabapentin 300 MG capsule Commonly known as:  NEURONTIN Take 1 capsule (300 mg total) by mouth 2 (two) times daily.   ibuprofen 600 MG tablet Commonly known as:  ADVIL,MOTRIN Take 1 tablet (600 mg total) by mouth every 6 (six) hours as needed for mild pain, moderate pain or cramping.   multivitamin tablet Take 1 tablet by mouth daily.   oxyCODONE 5 MG immediate release tablet Commonly known as:  Oxy IR/ROXICODONE Take 1 tablet (5 mg total) by mouth every 4 (  four) hours as needed for severe pain or breakthrough pain.      Future Appointments  Date Time Provider [place]  11/27/2017  2:30 PM Treyvon Blahut, Sallyanne Havers, MD [facility]  12/23/2017 10:30 AM Marlyn Tondreau, Sallyanne Havers, MD [facility]     Signed:  Verita Schneiders, MD, [place] Attending [place], [facility]

## 2017-11-14 NOTE — Discharge Instructions (Signed)
Abdominal Hysterectomy, Care After °This sheet gives you information about how to care for yourself after your procedure. Your health care provider may also give you more specific instructions. If you have problems or questions, contact your health care provider. °What can I expect after the procedure? °After your procedure, it is common to have: °· Pain. °· Fatigue. °· Poor appetite. °· Less interest in sex. °· Vaginal bleeding and discharge. You may need to use a sanitary napkin after this procedure. ° °Follow these instructions at home: °Bathing °· Do not take baths, swim, or use a hot tub until your health care provider approves. Ask your health care provider if you can take showers. You may only be allowed to take sponge baths for bathing. °· Keep the bandage (dressing) dry until your health care provider says it can be removed. °Incision care °· Follow instructions from your health care provider about how to take care of your incision. Make sure you: °? Wash your hands with soap and water before you change your bandage (dressing). If soap and water are not available, use hand sanitizer. °? Change your dressing as told by your health care provider. °? Leave stitches (sutures), skin glue, or adhesive strips in place. These skin closures may need to stay in place for 2 weeks or longer. If adhesive strip edges start to loosen and curl up, you may trim the loose edges. Do not remove adhesive strips completely unless your health care provider tells you to do that. °· Check your incision area every day for signs of infection. Check for: °? Redness, swelling, or pain. °? Fluid or blood. °? Warmth. °? Pus or a bad smell. °Activity °· Do gentle, daily exercises as told by your health care provider. You may be told to take short walks every day and go farther each time. °· Do not lift anything that is heavier than 10 lb (4.5 kg), or the limit that your health care provider tells you, until he or she says that it is  safe. °· Do not drive or use heavy machinery while taking prescription pain medicine. °· Do not drive for 24 hours if you were given a medicine to help you relax (sedative). °· Follow your health care provider's instructions about exercise, driving, and general activities. Ask your health care provider what activities are safe for you. °Lifestyle °· Do not douche, use tampons, or have sex for at least 6 weeks or as told by your health care provider. °· Do not drink alcohol until your health care provider approves. °· Drink enough fluid to keep your urine clear or pale yellow. °· Try to have someone at home with you for the first 1-2 weeks to help. °· Do not use any products that contain nicotine or tobacco, such as cigarettes and e-cigarettes. These can delay healing. If you need help quitting, ask your health care provider. °General instructions °· Take over-the-counter and prescription medicines only as told by your health care provider. °· Do not take aspirin or ibuprofen. These medicines can cause bleeding. °· To prevent or treat constipation while you are taking prescription pain medicine, your health care provider may recommend that you: °? Drink enough fluid to keep your urine clear or pale yellow. °? Take over-the-counter or prescription medicines. °? Eat foods that are high in fiber, such as fresh fruits and vegetables, whole grains, and beans. °? Limit foods that are high in fat and processed sugars, such as fried and sweet foods. °· Keep all   follow-up visits as told by your health care provider. This is important. °Contact a health care provider if: °· You have chills or fever. °· You have redness, swelling, or pain around your incision. °· You have fluid or blood coming from your incision. °· Your incision feels warm to the touch. °· You have pus or a bad smell coming from your incision. °· Your incision breaks open. °· You feel dizzy or light-headed. °· You have pain or bleeding when you urinate. °· You  have persistent diarrhea. °· You have persistent nausea and vomiting. °· You have abnormal vaginal discharge. °· You have a rash. °· You have any type of abnormal reaction or you develop an allergy to your medicine. °· Your pain medicine does not help. °Get help right away if: °· You have a fever and your symptoms suddenly get worse. °· You have severe abdominal pain. °· You have shortness of breath. °· You faint. °· You have pain, swelling, or redness in your leg. °· You have heavy vaginal bleeding with blood clots. °Summary °· After your procedure, it is common to have pain, fatigue and vaginal discharge. °· Do not take baths, swim, or use a hot tub until your health care provider approves. Ask your health care provider if you can take showers. You may only be allowed to take sponge baths for bathing. °· Follow your health care provider's instructions about exercise, driving, and general activities. Ask your health care provider what activities are safe for you. °· Do not lift anything that is heavier than 10 lb (4.5 kg), or the limit that your health care provider tells you, until he or she says that it is safe. °· Try to have someone at home with you for the first 1-2 weeks to help. °This information is not intended to replace advice given to you by your health care provider. Make sure you discuss any questions you have with your health care provider. °Document Released: 08/03/2004 Document Revised: 01/03/2016 Document Reviewed: 01/03/2016 °Elsevier Interactive Patient Education © 2017 [employer] ° °

## 2017-11-15 LAB — TYPE AND SCREEN
ABO/RH(D): O POS
Antibody Screen: NEGATIVE
UNIT DIVISION: 0
UNIT DIVISION: 0
UNIT DIVISION: 0
UNIT DIVISION: 0
Unit division: 0
Unit division: 0
Unit division: 0
Unit division: 0
Unit division: 0
Unit division: 0
Unit division: 0
Unit division: 0

## 2017-11-15 LAB — BPAM RBC
BLOOD PRODUCT EXPIRATION DATE: 201911082359
BLOOD PRODUCT EXPIRATION DATE: 201911082359
BLOOD PRODUCT EXPIRATION DATE: 201911122359
Blood Product Expiration Date: 201911082359
Blood Product Expiration Date: 201911082359
Blood Product Expiration Date: 201911122359
Blood Product Expiration Date: 201911122359
Blood Product Expiration Date: 201911122359
Blood Product Expiration Date: 201911122359
Blood Product Expiration Date: 201911132359
Blood Product Expiration Date: 201911132359
Blood Product Expiration Date: 201911132359
ISSUE DATE / TIME: 201910150844
ISSUE DATE / TIME: 201910160233
ISSUE DATE / TIME: 201910161413
ISSUE DATE / TIME: 201910161413
ISSUE DATE / TIME: 201910162103
ISSUE DATE / TIME: 201910170004
ISSUE DATE / TIME: 201910150844
ISSUE DATE / TIME: 201910150845
ISSUE DATE / TIME: 201910150845
ISSUE DATE / TIME: 201910160907
UNIT TYPE AND RH: 5100
UNIT TYPE AND RH: 5100
UNIT TYPE AND RH: 5100
UNIT TYPE AND RH: 5100
UNIT TYPE AND RH: 5100
Unit Type and Rh: 5100
Unit Type and Rh: 5100
Unit Type and Rh: 5100
Unit Type and Rh: 5100
Unit Type and Rh: 5100
Unit Type and Rh: 5100
Unit Type and Rh: 5100

## 2017-11-17 LAB — PREPARE FRESH FROZEN PLASMA
UNIT DIVISION: 0
UNIT DIVISION: 0
Unit division: 0
Unit division: 0

## 2017-11-17 LAB — BPAM FFP
BLOOD PRODUCT EXPIRATION DATE: 201910202359
BLOOD PRODUCT EXPIRATION DATE: 201910202359
BLOOD PRODUCT EXPIRATION DATE: 201910202359
Blood Product Expiration Date: 201910202359
Blood Product Expiration Date: 201910202359
Blood Product Expiration Date: 201910202359
ISSUE DATE / TIME: 201910151206
ISSUE DATE / TIME: 201910151206
ISSUE DATE / TIME: 201910151200
ISSUE DATE / TIME: 201910151200
UNIT TYPE AND RH: 5100
UNIT TYPE AND RH: 9500
Unit Type and Rh: 5100
Unit Type and Rh: 5100
Unit Type and Rh: 9500
Unit Type and Rh: 9500

## 2017-11-17 NOTE — Progress Notes (Signed)
11/11/2017 Surgical Pathology Diagnosis 1. Uterus and cervix CERVIX: - NABOTHIAN CYSTS. UTERUS: - ENDOMETRIUM: MARKEDLY ATTENUATED ENDOMETRIUM WITH UNDERLYING FIBROSIS PRESENT FOCALLY. - MYOMETRIUM: LEIOMYOMATA. - SEROSA: NO SIGNIFICANT PATHOLOGIC FINDINGS. 2. Fallopian tube, left FALLOPIAN TUBE: - NO SIGNIFICANT PATHOLOGIC FINDINGS. 3. Fallopian tube, right FALLOPIAN TUBE: - NO SIGNIFICANT PATHOLOGIC FINDINGS. Gillie Manners MD Pathologist, Electronic Signature (Case signed 11/14/2017)  Patient was called and voice mail was left letting her know to check MyChart for details of benign pathology results.   She was also told to call for any questions or concerns.  Verita Schneiders, MD

## 2017-11-24 NOTE — Telephone Encounter (Signed)
In error

## 2017-11-27 ENCOUNTER — Encounter: Payer: Self-pay | Admitting: Obstetrics & Gynecology

## 2017-11-27 ENCOUNTER — Ambulatory Visit (INDEPENDENT_AMBULATORY_CARE_PROVIDER_SITE_OTHER): Payer: BLUE CROSS/BLUE SHIELD | Admitting: Obstetrics & Gynecology

## 2017-11-27 VITALS — BP 126/83 | HR 96 | Wt 243.0 lb

## 2017-11-27 DIAGNOSIS — Z90710 Acquired absence of both cervix and uterus: Secondary | ICD-10-CM

## 2017-11-27 DIAGNOSIS — Z4889 Encounter for other specified surgical aftercare: Secondary | ICD-10-CM

## 2017-11-27 NOTE — Progress Notes (Signed)
    Subjective:     Vanessa Elliott is a 42 y.o. G17P1001 female who presents to the clinic 2 weeks status post total abdominal hysterectomy for large fibroid uterus.  Eating a regular diet without difficulty. Bowel movements are normal. Pain is controlled with current analgesics. Medications being used: acetaminophen and ibuprofen (OTC). Never took prescribed narcotics.  The following portions of the patient's history were reviewed and updated as appropriate: allergies, current medications, past family history, past medical history, past social history, past surgical history and problem list.  Review of Systems Pertinent items noted in HPI and remainder of comprehensive ROS otherwise negative.    Objective:    BP 126/83   Pulse 96   Wt 243 lb (110.2 kg)   LMP 10/25/2017 (Exact Date)   BMI 39.22 kg/m  General:  alert and no distress  Abdomen: soft, bowel sounds active, non-tender  Incision:   healing well, no drainage, no erythema, no hernia, no seroma, no swelling, no dehiscence, incision well approximated    11/11/2017 Surgical Pathology Diagnosis 1. Uterus and cervix CERVIX: - NABOTHIAN CYSTS. UTERUS: - ENDOMETRIUM: MARKEDLY ATTENUATED ENDOMETRIUM WITH UNDERLYING FIBROSIS PRESENT FOCALLY. - MYOMETRIUM: LEIOMYOMATA. - SEROSA: NO SIGNIFICANT PATHOLOGIC FINDINGS. 2. Fallopian tube, left FALLOPIAN TUBE: - NO SIGNIFICANT PATHOLOGIC FINDINGS. 3. Fallopian tube, right FALLOPIAN TUBE: - NO SIGNIFICANT PATHOLOGIC FINDINGS. Gillie Manners MD Pathologist, Electronic Signature (Case signed 11/14/2017) Assessment:    Doing well postoperatively. Operative findings again reviewed. Pathology report discussed.    Plan:   1. Continue any current medications. 2. Wound care discussed. 3. Activity restrictions: no lifting more than 15 pounds 4. Anticipated return to work: 8 weeks. Work Quarry manager provided. 5. Follow up: 4 weeks for pelvic exam.     Verita Schneiders, MD,  [place], [facility] for [employer], [place]

## 2017-12-23 ENCOUNTER — Ambulatory Visit (INDEPENDENT_AMBULATORY_CARE_PROVIDER_SITE_OTHER): Payer: BLUE CROSS/BLUE SHIELD | Admitting: Obstetrics & Gynecology

## 2017-12-23 ENCOUNTER — Encounter: Payer: Self-pay | Admitting: Obstetrics & Gynecology

## 2017-12-23 VITALS — BP 129/77 | HR 106 | Ht 66.0 in | Wt 249.0 lb

## 2017-12-23 DIAGNOSIS — Z90710 Acquired absence of both cervix and uterus: Secondary | ICD-10-CM

## 2017-12-23 DIAGNOSIS — Z9889 Other specified postprocedural states: Secondary | ICD-10-CM

## 2017-12-23 DIAGNOSIS — Z09 Encounter for follow-up examination after completed treatment for conditions other than malignant neoplasm: Secondary | ICD-10-CM

## 2017-12-23 NOTE — Patient Instructions (Signed)
Return to clinic for any scheduled appointments or for any gynecologic concerns as needed.    Annual exam due 11/2018, no pap needed.

## 2017-12-23 NOTE — Progress Notes (Signed)
     Subjective:     Vanessa Elliott is a 42 y.o.  G50P1001 female who presents to the clinic 6 weeks status post status post total abdominal hysterectomy for large fibroid uterus.  Eating a regular diet without difficulty. Bowel movements are normal. Pain is controlled with current analgesics. Medications being used: acetaminophen and ibuprofen (OTC). Never took prescribed narcotics.  The following portions of the patient's history were reviewed and updated as appropriate: allergies, current medications, past family history, past medical history, past social history, past surgical history and problem list.  Normal mammogram in 05/2017  Review of Systems Pertinent items noted in HPI and remainder of comprehensive ROS otherwise negative.    Objective:    BP 129/77   Pulse (!) 106   Ht 5\' 6"  (1.676 m)   Wt 249 lb (112.9 kg)   LMP 10/25/2017 (Exact Date)   BMI 40.19 kg/m  General:  alert and no distress  Abdomen: soft, bowel sounds active, non-tender  Incision:   healing well, no drainage, no erythema, no hernia, no seroma, no swelling, no dehiscence, incision well approximated  Pelvic: Well-healed vaginal cuff, normal discharge     Assessment:    Doing well postoperatively.    Plan:   1. Continue any current medications. 2. Wound care discussed. 3. Activity restrictions: no lifting more than 15 pounds 4. Anticipated return to work: 2-3 weeks. 5. Follow up: for annual exam next year or other gynecologic concerns.  Verita Schneiders, MD, [place] for [employer], [place]

## 2018-10-15 IMAGING — XA IR ANGIO/PELVIC SELECTIVE EA VESSEL
12 of 13 series · 12 of 24 positions shown · IV contrast (IODINE)
Comparison: none

CLINICAL DATA: Gigantic central uterine mass measuring
approximately 22 cm in height and demonstrating diffuse enhancement
by MRI. Prior to elective hysterectomy, uterine artery embolization
has been requested to reduce the risk complication and blood loss
during hysterectomy given the size of the dominant mass and
prominent uterine vascularity present.

[Series 1: care body 4 · 1 of 35 frames shown (1 of 9)]
[frame 27/35]
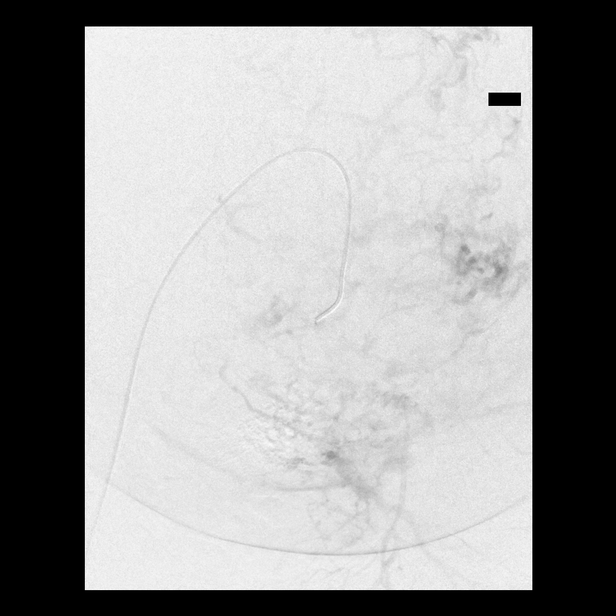

[Series 2: care body 4 · 1 of 29 frames shown (2 of 9)]
[frame 16/29]
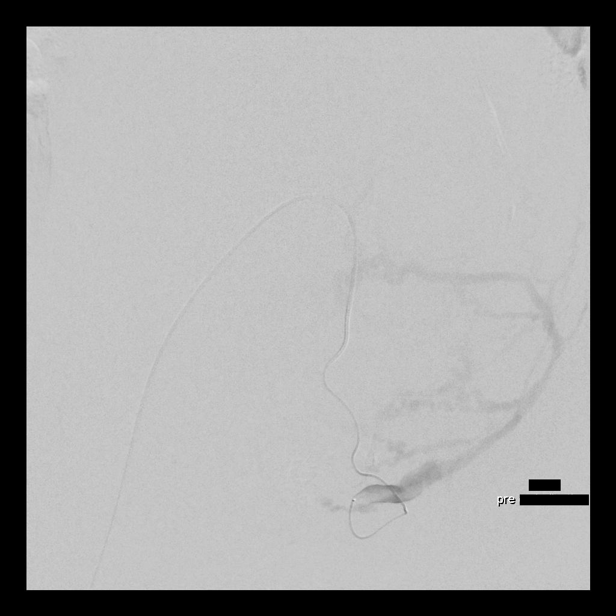

[Series 3: care body 4 · 1 of 15 frames shown (3 of 9)]
[frame 11/15]
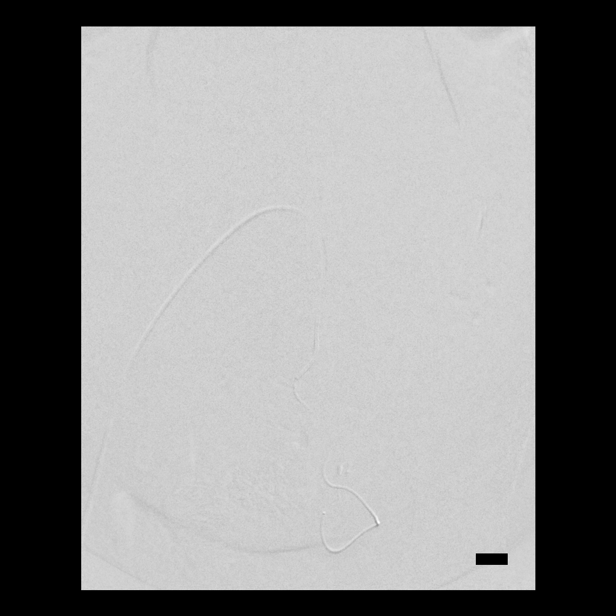

[Series 4: care body 4 · 1 of 27 frames shown (4 of 9)]
[frame 23/27]
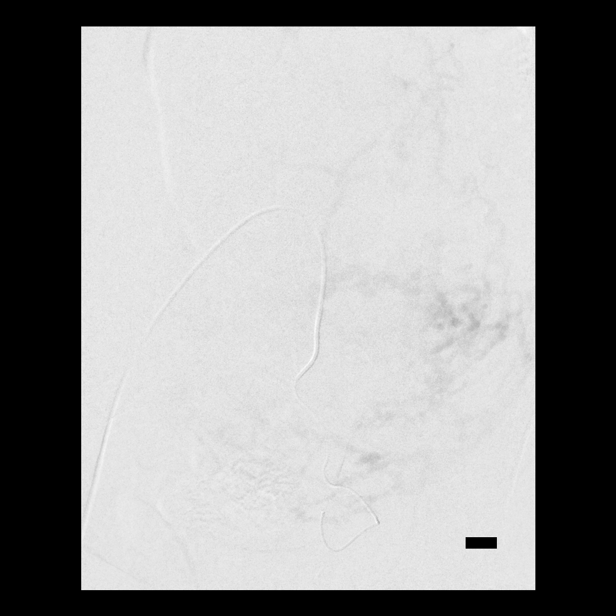

[Series 5: fl - angio · 1 of 116 frames shown (1 of 2)]
[frame 99/116]
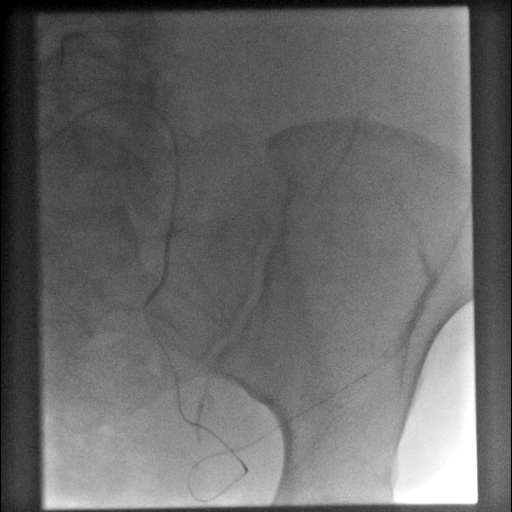

[Series 6: care body 4 · 1 of 33 frames shown (5 of 9)]
[frame 29/33]
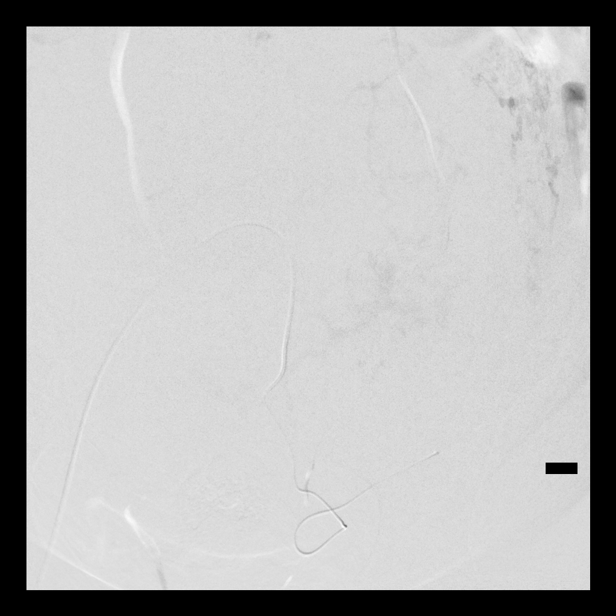

[Series 7: care body 4 · 1 of 20 frames shown (6 of 9)]
[frame 18/20]
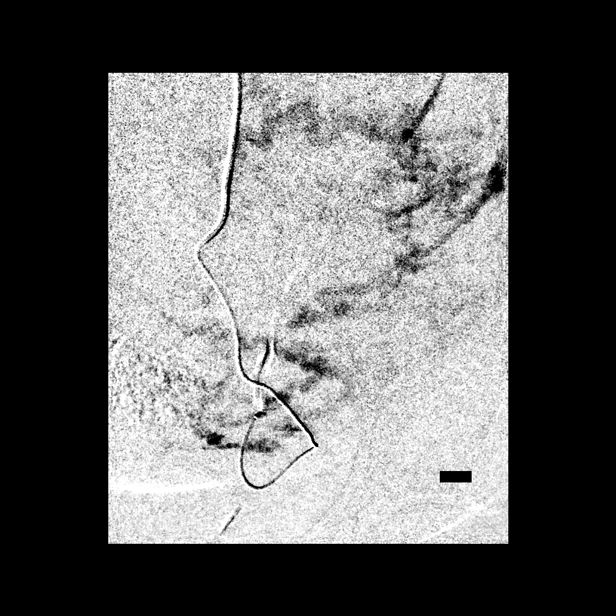

[Series 8: care body 4 · 1 of 38 frames shown (7 of 9)]
[frame 33/38]
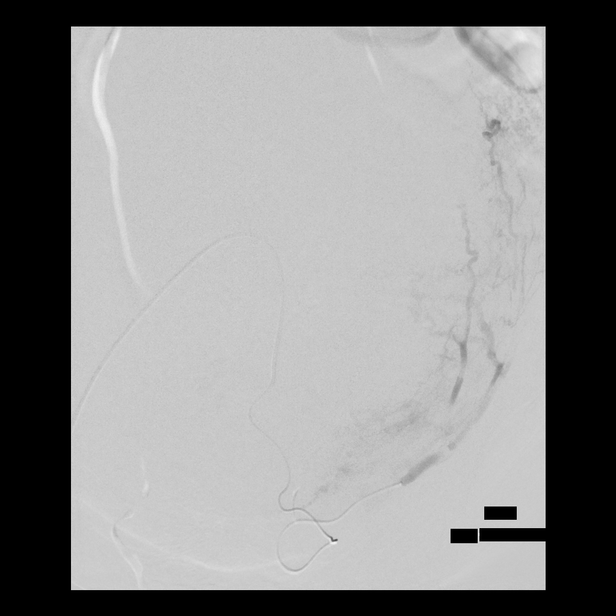

[Series 9: care body 4 · 1 of 48 frames shown (8 of 9)]
[frame 41/48]
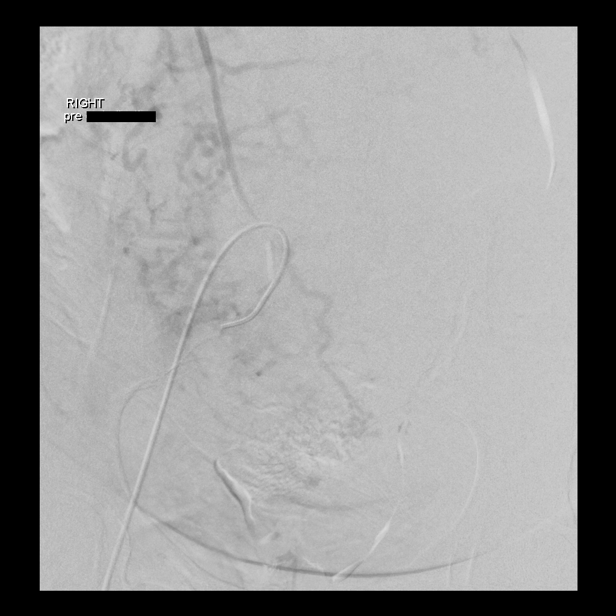

[Series 11: care body 4 · 1 of 40 frames shown (9 of 9)]
[frame 7/40]
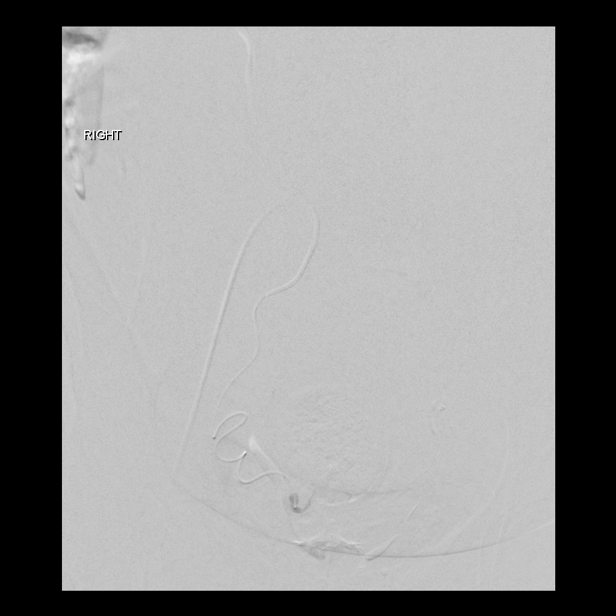

[Series 12: fl - angio · 1 of 31 frames shown (2 of 2)]
[frame 2/31]
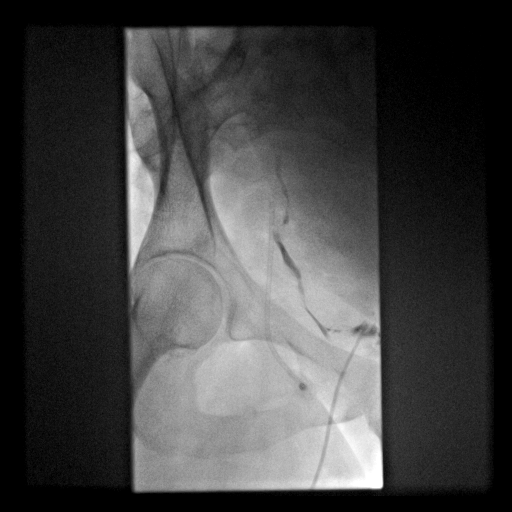

[Series 300: ld dsa body · 1 of 1 slices shown]
[im 1/1]
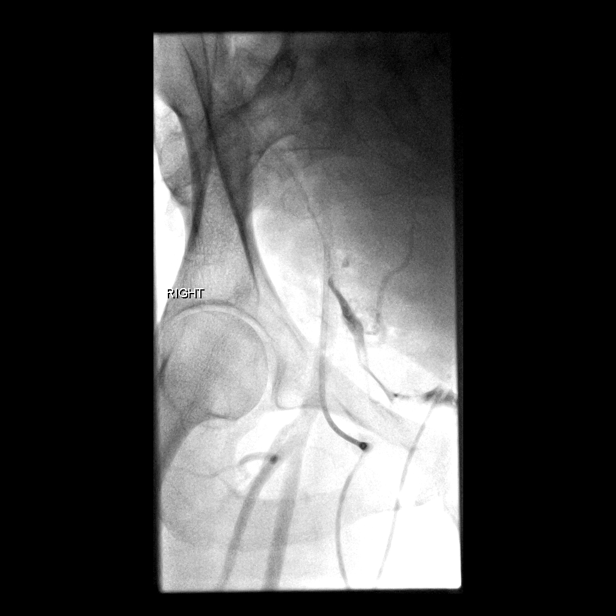

[12 of 24 positions shown; findings below may reference images not displayed]

EXAM:
1. ULTRASOUND GUIDANCE FOR VASCULAR ACCESS OF THE RIGHT COMMON
FEMORAL ARTERY
2. SELECTIVE ARTERIOGRAPHY OF THE LEFT INTERNAL ILIAC ARTERY
3. SELECTIVE ARTERIOGRAPHY OF THE LEFT UTERINE ARTERY
4. TRANSCATHETER EMBOLIZATION OF THE LEFT UTERINE ARTERY
5. SELECTIVE ARTERIOGRAPHY OF THE RIGHT INTERNAL ILIAC ARTERY
6. SELECTIVE ARTERIOGRAPHY OF THE RIGHT UTERINE ARTERY
7. TRANSCATHETER EMBOLIZATION OF THE RIGHT UTERINE ARTERY

ANESTHESIA/SEDATION:
6.0 mg IV Versed; 200 mcg IV Fentanyl.

Total Moderate Sedation Time: 110 minutes.

The patient's level of consciousness and physiologic status were
continuously monitored during the procedure by Radiology nursing.

Additional Medications: 2 g IV Ancef, 30 mg IV Toradol.

FLUOROSCOPY TIME:  22 minutes and 24 seconds.  2582 mGy.

CONTRAST:  225 mL Omnipaque 300

PROCEDURE:
The procedure, risks, benefits, and alternatives were explained to
the patient. Questions regarding the procedure were encouraged and
answered. The patient understands and consents to the procedure. A
time-out was performed prior to initiating the procedure.

The right groin was prepped with chlorhexidine in a sterile fashion,
and a sterile drape was applied covering the operative field. A
sterile gown and sterile gloves were used for the procedure. Local
anesthesia was provided with 1% Lidocaine.

Ultrasound was used to confirm patency of the right common femoral
artery. After small skin incision, a 21 gauge needle was advanced
into the right common femoral artery under direct ultrasound
guidance. Ultrasound image documentation was performed. After
establishing guide wire access, a 5-French sheath was placed.

A 5 Fr diagnostic catheter was advanced over a guidewire into the
distal abdominal aorta. The catheter was then used to selectively
catheterize the left common iliac artery followed by the left
internal iliac artery. Selective arteriography was performed of the
left internal iliac artery.

A 2.8 Fr coaxial microcatheter was then introduced through the
diagnostic catheter and advanced into the left uterine artery over a
guide wire utilizing road-mapping technique. Selective arteriography
of the left uterine artery was performed through the microcatheter.

Left uterine artery embolization was then performed with
installation of microsphere particles. Follow-up arteriography was
performed after embolization. Additional embolization was performed
with instillation of cut Gel-Foam pledgets mixed with diluted
contrast. Additional uterine arteriography was performed through the
microcatheter.

The microcatheter was removed. The 5 French diagnostic catheter was
then retracted and used to selectively catheterize the right
internal iliac artery. Selective arteriography was performed of the
right internal iliac artery. The microcatheter was then reintroduced
and advanced into the right uterine artery over a guidewire
utilizing road-mapping technique. Selective arteriography of the
right uterine artery was then performed through the microcatheter.

Right uterine artery embolization was then performed with
installation of microsphere particles. Follow-up arteriography was
performed after embolization. Additional embolization was performed
with instillation of cut Gel-Foam pledgets mixed with diluted
contrast. Additional uterine arteriography was performed through the
microcatheter. Catheters were removed.

Right femoral arteriotomy hemostasis after sheath removal: Cordis
ExoSeal

COMPLICATIONS:
None.
FINDINGS: Left internal iliac arteriography demonstrates a huge left uterine
artery supplying multiple large trunks to a massively enlarged
uterus. Left uterine artery embolization was performed utilizing 1
vial of 500-700 micron sized and 3 vials of 700-900 micron sized
Embosphere particles. This resulted in some slowing of flow in
distal branches supplying the uterus and dominant fibroid. However,
significant flow remained in multiple trunks and therefore Gel-Foam
pledgets were utilized in performing further embolization which
resulted in further occlusion of branches completion supplying the
uterus.

Right internal iliac arteriography demonstrates a large right
uterine artery supplying multiple large trunks to a massively
enlarged uterus. Right uterine artery embolization was performed
utilizing 1 vial of 500-700 micron sized and 3 vials of 700-900
micron sized Embosphere particles. This resulted in some slowing of
flow in distal branches supplying the uterus and dominant fibroid.
However, significant flow remained in multiple trunks and therefore
Gel-Foam pledgets were utilized in performing further embolization
which resulted in further occlusion of branches completion supplying
the uterus.

Adequate hemostasis was achieved at the femoral arteriotomy site.
IMPRESSION: Successful bilateral uterine artery embolization for preoperative
embolization of massively enlarged uterine arteries supplying the
huge central uterine mass/fibroid. Adequate occlusion of branch
vessels supplying uterine fibroids was achieved with microsphere
particle and Gel-Foam embolization. The patient was admitted for
planned hysterectomy tomorrow.

## 2019-10-08 ENCOUNTER — Ambulatory Visit (LOCAL_COMMUNITY_HEALTH_CENTER): Payer: BC Managed Care – PPO

## 2019-10-08 ENCOUNTER — Other Ambulatory Visit: Payer: Self-pay

## 2019-10-08 DIAGNOSIS — Z111 Encounter for screening for respiratory tuberculosis: Secondary | ICD-10-CM

## 2019-10-11 ENCOUNTER — Other Ambulatory Visit: Payer: Self-pay

## 2019-10-11 ENCOUNTER — Ambulatory Visit (LOCAL_COMMUNITY_HEALTH_CENTER): Payer: BC Managed Care – PPO

## 2019-10-11 DIAGNOSIS — Z111 Encounter for screening for respiratory tuberculosis: Secondary | ICD-10-CM

## 2019-10-11 LAB — TB SKIN TEST
Induration: 0 mm
TB Skin Test: NEGATIVE

## 2020-01-07 ENCOUNTER — Ambulatory Visit (LOCAL_COMMUNITY_HEALTH_CENTER): Payer: BC Managed Care – PPO

## 2020-01-07 ENCOUNTER — Other Ambulatory Visit: Payer: Self-pay

## 2020-01-07 DIAGNOSIS — Z23 Encounter for immunization: Secondary | ICD-10-CM

## 2020-01-07 NOTE — Progress Notes (Signed)
Flu vaccine given; tolerated well Tee Richeson, RN  

## 2020-06-08 ENCOUNTER — Ambulatory Visit (LOCAL_COMMUNITY_HEALTH_CENTER): Payer: Self-pay

## 2020-06-08 ENCOUNTER — Other Ambulatory Visit: Payer: Self-pay

## 2020-06-08 DIAGNOSIS — Z7185 Encounter for immunization safety counseling: Secondary | ICD-10-CM

## 2020-06-08 NOTE — Progress Notes (Signed)
Patient presented to nurse clinic for immunization counseling. Vaccine record reviewed with patient and Varicella, MMR, HepB, and TB skin test charges discussed with patient. Updated immunization copy given to patient. Pt verbalized understanding.  

## 2021-10-24 ENCOUNTER — Ambulatory Visit: Payer: Self-pay

## 2021-10-25 ENCOUNTER — Ambulatory Visit (LOCAL_COMMUNITY_HEALTH_CENTER): Payer: Self-pay

## 2021-10-25 DIAGNOSIS — Z23 Encounter for immunization: Secondary | ICD-10-CM

## 2021-10-25 DIAGNOSIS — Z719 Counseling, unspecified: Secondary | ICD-10-CM

## 2023-05-05 ENCOUNTER — Other Ambulatory Visit: Payer: Self-pay | Admitting: Family Medicine

## 2023-05-05 DIAGNOSIS — Z1231 Encounter for screening mammogram for malignant neoplasm of breast: Secondary | ICD-10-CM
